# Patient Record
Sex: Female | Born: 1980
Health system: Southern US, Community
[De-identification: ages and names within clinical notes are randomized; demographics above are authoritative.]

## PROBLEM LIST (undated history)

## (undated) DIAGNOSIS — R45851 Suicidal ideations: Secondary | ICD-10-CM

## (undated) DIAGNOSIS — Z3490 Encounter for supervision of normal pregnancy, unspecified, unspecified trimester: Secondary | ICD-10-CM

## (undated) DIAGNOSIS — F603 Borderline personality disorder: Secondary | ICD-10-CM

## (undated) DIAGNOSIS — F1920 Other psychoactive substance dependence, uncomplicated: Secondary | ICD-10-CM

## (undated) HISTORY — PX: CHOLECYSTECTOMY: SHX55

---

## 2000-05-24 ENCOUNTER — Emergency Department (HOSPITAL_COMMUNITY): Admission: EM | Admit: 2000-05-24 | Discharge: 2000-05-24 | Payer: Self-pay | Admitting: Emergency Medicine

## 2001-11-22 ENCOUNTER — Encounter: Payer: Self-pay | Admitting: Emergency Medicine

## 2001-11-22 ENCOUNTER — Emergency Department (HOSPITAL_COMMUNITY): Admission: EM | Admit: 2001-11-22 | Discharge: 2001-11-23 | Payer: Self-pay | Admitting: Emergency Medicine

## 2011-04-19 DIAGNOSIS — Z5181 Encounter for therapeutic drug level monitoring: Secondary | ICD-10-CM | POA: Diagnosis not present

## 2011-04-27 DIAGNOSIS — R41840 Attention and concentration deficit: Secondary | ICD-10-CM | POA: Diagnosis not present

## 2011-04-27 DIAGNOSIS — Q079 Congenital malformation of nervous system, unspecified: Secondary | ICD-10-CM | POA: Diagnosis not present

## 2011-04-27 DIAGNOSIS — M839 Adult osteomalacia, unspecified: Secondary | ICD-10-CM | POA: Diagnosis not present

## 2011-04-27 DIAGNOSIS — Z79899 Other long term (current) drug therapy: Secondary | ICD-10-CM | POA: Diagnosis not present

## 2011-04-27 DIAGNOSIS — F319 Bipolar disorder, unspecified: Secondary | ICD-10-CM | POA: Diagnosis not present

## 2011-04-27 DIAGNOSIS — F5105 Insomnia due to other mental disorder: Secondary | ICD-10-CM | POA: Diagnosis not present

## 2011-04-27 DIAGNOSIS — Z13220 Encounter for screening for lipoid disorders: Secondary | ICD-10-CM | POA: Diagnosis not present

## 2011-04-27 DIAGNOSIS — M545 Low back pain, unspecified: Secondary | ICD-10-CM | POA: Diagnosis not present

## 2011-04-27 DIAGNOSIS — G9009 Other idiopathic peripheral autonomic neuropathy: Secondary | ICD-10-CM | POA: Diagnosis not present

## 2011-04-27 DIAGNOSIS — F489 Nonpsychotic mental disorder, unspecified: Secondary | ICD-10-CM | POA: Diagnosis not present

## 2011-05-18 DIAGNOSIS — F5105 Insomnia due to other mental disorder: Secondary | ICD-10-CM | POA: Diagnosis not present

## 2011-05-18 DIAGNOSIS — M545 Low back pain, unspecified: Secondary | ICD-10-CM | POA: Diagnosis not present

## 2011-05-18 DIAGNOSIS — R41840 Attention and concentration deficit: Secondary | ICD-10-CM | POA: Diagnosis not present

## 2011-05-18 DIAGNOSIS — G9009 Other idiopathic peripheral autonomic neuropathy: Secondary | ICD-10-CM | POA: Diagnosis not present

## 2011-05-18 DIAGNOSIS — F489 Nonpsychotic mental disorder, unspecified: Secondary | ICD-10-CM | POA: Diagnosis not present

## 2011-05-25 DIAGNOSIS — R41840 Attention and concentration deficit: Secondary | ICD-10-CM | POA: Diagnosis not present

## 2011-05-25 DIAGNOSIS — Z5181 Encounter for therapeutic drug level monitoring: Secondary | ICD-10-CM | POA: Diagnosis not present

## 2011-06-15 DIAGNOSIS — R41840 Attention and concentration deficit: Secondary | ICD-10-CM | POA: Diagnosis not present

## 2011-06-15 DIAGNOSIS — Z5181 Encounter for therapeutic drug level monitoring: Secondary | ICD-10-CM | POA: Diagnosis not present

## 2011-06-15 DIAGNOSIS — M545 Low back pain, unspecified: Secondary | ICD-10-CM | POA: Diagnosis not present

## 2011-06-15 DIAGNOSIS — F5105 Insomnia due to other mental disorder: Secondary | ICD-10-CM | POA: Diagnosis not present

## 2011-06-15 DIAGNOSIS — G9009 Other idiopathic peripheral autonomic neuropathy: Secondary | ICD-10-CM | POA: Diagnosis not present

## 2011-06-15 DIAGNOSIS — F489 Nonpsychotic mental disorder, unspecified: Secondary | ICD-10-CM | POA: Diagnosis not present

## 2011-07-15 DIAGNOSIS — M545 Low back pain, unspecified: Secondary | ICD-10-CM | POA: Diagnosis not present

## 2011-07-15 DIAGNOSIS — Z5181 Encounter for therapeutic drug level monitoring: Secondary | ICD-10-CM | POA: Diagnosis not present

## 2011-07-22 DIAGNOSIS — Z5181 Encounter for therapeutic drug level monitoring: Secondary | ICD-10-CM | POA: Diagnosis not present

## 2011-08-16 DIAGNOSIS — F5105 Insomnia due to other mental disorder: Secondary | ICD-10-CM | POA: Diagnosis not present

## 2011-08-16 DIAGNOSIS — R41840 Attention and concentration deficit: Secondary | ICD-10-CM | POA: Diagnosis not present

## 2011-08-16 DIAGNOSIS — F319 Bipolar disorder, unspecified: Secondary | ICD-10-CM | POA: Diagnosis not present

## 2011-08-16 DIAGNOSIS — Z5181 Encounter for therapeutic drug level monitoring: Secondary | ICD-10-CM | POA: Diagnosis not present

## 2011-08-16 DIAGNOSIS — G9009 Other idiopathic peripheral autonomic neuropathy: Secondary | ICD-10-CM | POA: Diagnosis not present

## 2011-08-16 DIAGNOSIS — M545 Low back pain, unspecified: Secondary | ICD-10-CM | POA: Diagnosis not present

## 2011-08-16 DIAGNOSIS — Z79899 Other long term (current) drug therapy: Secondary | ICD-10-CM | POA: Diagnosis not present

## 2011-09-16 DIAGNOSIS — M545 Low back pain, unspecified: Secondary | ICD-10-CM | POA: Diagnosis not present

## 2011-09-17 DIAGNOSIS — K7689 Other specified diseases of liver: Secondary | ICD-10-CM | POA: Diagnosis not present

## 2011-09-17 DIAGNOSIS — Z79899 Other long term (current) drug therapy: Secondary | ICD-10-CM | POA: Diagnosis not present

## 2011-09-17 DIAGNOSIS — D649 Anemia, unspecified: Secondary | ICD-10-CM | POA: Diagnosis not present

## 2011-09-17 DIAGNOSIS — R112 Nausea with vomiting, unspecified: Secondary | ICD-10-CM | POA: Diagnosis not present

## 2011-09-17 DIAGNOSIS — R109 Unspecified abdominal pain: Secondary | ICD-10-CM | POA: Diagnosis not present

## 2011-09-17 DIAGNOSIS — B1920 Unspecified viral hepatitis C without hepatic coma: Secondary | ICD-10-CM | POA: Diagnosis not present

## 2011-09-17 DIAGNOSIS — R509 Fever, unspecified: Secondary | ICD-10-CM | POA: Diagnosis not present

## 2011-09-17 DIAGNOSIS — R1084 Generalized abdominal pain: Secondary | ICD-10-CM | POA: Diagnosis not present

## 2011-09-20 DIAGNOSIS — R799 Abnormal finding of blood chemistry, unspecified: Secondary | ICD-10-CM | POA: Diagnosis not present

## 2011-09-20 DIAGNOSIS — R748 Abnormal levels of other serum enzymes: Secondary | ICD-10-CM | POA: Diagnosis not present

## 2011-09-20 DIAGNOSIS — R300 Dysuria: Secondary | ICD-10-CM | POA: Diagnosis not present

## 2011-09-20 DIAGNOSIS — M545 Low back pain, unspecified: Secondary | ICD-10-CM | POA: Diagnosis not present

## 2011-09-21 DIAGNOSIS — Z5181 Encounter for therapeutic drug level monitoring: Secondary | ICD-10-CM | POA: Diagnosis not present

## 2011-10-14 DIAGNOSIS — Z5181 Encounter for therapeutic drug level monitoring: Secondary | ICD-10-CM | POA: Diagnosis not present

## 2011-10-14 DIAGNOSIS — R109 Unspecified abdominal pain: Secondary | ICD-10-CM | POA: Diagnosis not present

## 2011-10-14 DIAGNOSIS — R41840 Attention and concentration deficit: Secondary | ICD-10-CM | POA: Diagnosis not present

## 2011-10-14 DIAGNOSIS — D649 Anemia, unspecified: Secondary | ICD-10-CM | POA: Diagnosis not present

## 2011-10-14 DIAGNOSIS — M545 Low back pain, unspecified: Secondary | ICD-10-CM | POA: Diagnosis not present

## 2011-10-14 DIAGNOSIS — F319 Bipolar disorder, unspecified: Secondary | ICD-10-CM | POA: Diagnosis not present

## 2011-10-14 DIAGNOSIS — B1710 Acute hepatitis C without hepatic coma: Secondary | ICD-10-CM | POA: Diagnosis not present

## 2011-10-14 DIAGNOSIS — F5105 Insomnia due to other mental disorder: Secondary | ICD-10-CM | POA: Diagnosis not present

## 2011-10-14 DIAGNOSIS — G9009 Other idiopathic peripheral autonomic neuropathy: Secondary | ICD-10-CM | POA: Diagnosis not present

## 2011-10-14 DIAGNOSIS — F489 Nonpsychotic mental disorder, unspecified: Secondary | ICD-10-CM | POA: Diagnosis not present

## 2011-11-14 DIAGNOSIS — M545 Low back pain, unspecified: Secondary | ICD-10-CM | POA: Diagnosis not present

## 2011-11-14 DIAGNOSIS — Z5181 Encounter for therapeutic drug level monitoring: Secondary | ICD-10-CM | POA: Diagnosis not present

## 2011-12-14 DIAGNOSIS — Z5181 Encounter for therapeutic drug level monitoring: Secondary | ICD-10-CM | POA: Diagnosis not present

## 2011-12-14 DIAGNOSIS — G9009 Other idiopathic peripheral autonomic neuropathy: Secondary | ICD-10-CM | POA: Diagnosis not present

## 2011-12-14 DIAGNOSIS — M545 Low back pain, unspecified: Secondary | ICD-10-CM | POA: Diagnosis not present

## 2011-12-14 DIAGNOSIS — R41840 Attention and concentration deficit: Secondary | ICD-10-CM | POA: Diagnosis not present

## 2011-12-14 DIAGNOSIS — F5105 Insomnia due to other mental disorder: Secondary | ICD-10-CM | POA: Diagnosis not present

## 2011-12-14 DIAGNOSIS — F489 Nonpsychotic mental disorder, unspecified: Secondary | ICD-10-CM | POA: Diagnosis not present

## 2012-01-13 DIAGNOSIS — M545 Low back pain, unspecified: Secondary | ICD-10-CM | POA: Diagnosis not present

## 2012-01-13 DIAGNOSIS — R41840 Attention and concentration deficit: Secondary | ICD-10-CM | POA: Diagnosis not present

## 2012-01-13 DIAGNOSIS — Z5181 Encounter for therapeutic drug level monitoring: Secondary | ICD-10-CM | POA: Diagnosis not present

## 2012-02-13 DIAGNOSIS — M545 Low back pain, unspecified: Secondary | ICD-10-CM | POA: Diagnosis not present

## 2012-02-13 DIAGNOSIS — R41840 Attention and concentration deficit: Secondary | ICD-10-CM | POA: Diagnosis not present

## 2012-02-13 DIAGNOSIS — G9009 Other idiopathic peripheral autonomic neuropathy: Secondary | ICD-10-CM | POA: Diagnosis not present

## 2012-02-13 DIAGNOSIS — Z5181 Encounter for therapeutic drug level monitoring: Secondary | ICD-10-CM | POA: Diagnosis not present

## 2012-02-13 DIAGNOSIS — F5105 Insomnia due to other mental disorder: Secondary | ICD-10-CM | POA: Diagnosis not present

## 2012-02-13 DIAGNOSIS — F489 Nonpsychotic mental disorder, unspecified: Secondary | ICD-10-CM | POA: Diagnosis not present

## 2012-03-14 DIAGNOSIS — R41840 Attention and concentration deficit: Secondary | ICD-10-CM | POA: Diagnosis not present

## 2012-03-14 DIAGNOSIS — M545 Low back pain, unspecified: Secondary | ICD-10-CM | POA: Diagnosis not present

## 2012-03-14 DIAGNOSIS — Z5181 Encounter for therapeutic drug level monitoring: Secondary | ICD-10-CM | POA: Diagnosis not present

## 2012-03-17 DIAGNOSIS — R718 Other abnormality of red blood cells: Secondary | ICD-10-CM | POA: Diagnosis not present

## 2012-03-17 DIAGNOSIS — R7309 Other abnormal glucose: Secondary | ICD-10-CM | POA: Diagnosis not present

## 2012-03-17 DIAGNOSIS — R220 Localized swelling, mass and lump, head: Secondary | ICD-10-CM | POA: Diagnosis not present

## 2012-03-17 DIAGNOSIS — R221 Localized swelling, mass and lump, neck: Secondary | ICD-10-CM | POA: Diagnosis not present

## 2012-03-17 DIAGNOSIS — R51 Headache: Secondary | ICD-10-CM | POA: Diagnosis not present

## 2012-04-13 DIAGNOSIS — F5105 Insomnia due to other mental disorder: Secondary | ICD-10-CM | POA: Diagnosis not present

## 2012-04-13 DIAGNOSIS — G9009 Other idiopathic peripheral autonomic neuropathy: Secondary | ICD-10-CM | POA: Diagnosis not present

## 2012-04-13 DIAGNOSIS — M545 Low back pain, unspecified: Secondary | ICD-10-CM | POA: Diagnosis not present

## 2012-04-13 DIAGNOSIS — R41840 Attention and concentration deficit: Secondary | ICD-10-CM | POA: Diagnosis not present

## 2012-04-13 DIAGNOSIS — J029 Acute pharyngitis, unspecified: Secondary | ICD-10-CM | POA: Diagnosis not present

## 2012-04-13 DIAGNOSIS — F489 Nonpsychotic mental disorder, unspecified: Secondary | ICD-10-CM | POA: Diagnosis not present

## 2012-05-14 DIAGNOSIS — Z5181 Encounter for therapeutic drug level monitoring: Secondary | ICD-10-CM | POA: Diagnosis not present

## 2012-05-14 DIAGNOSIS — M545 Low back pain, unspecified: Secondary | ICD-10-CM | POA: Diagnosis not present

## 2012-06-12 DIAGNOSIS — M545 Low back pain, unspecified: Secondary | ICD-10-CM | POA: Diagnosis not present

## 2012-06-12 DIAGNOSIS — Z5181 Encounter for therapeutic drug level monitoring: Secondary | ICD-10-CM | POA: Diagnosis not present

## 2012-06-12 DIAGNOSIS — Z79899 Other long term (current) drug therapy: Secondary | ICD-10-CM | POA: Diagnosis not present

## 2012-06-12 DIAGNOSIS — G9009 Other idiopathic peripheral autonomic neuropathy: Secondary | ICD-10-CM | POA: Diagnosis not present

## 2012-06-12 DIAGNOSIS — R41840 Attention and concentration deficit: Secondary | ICD-10-CM | POA: Diagnosis not present

## 2012-06-12 DIAGNOSIS — F5105 Insomnia due to other mental disorder: Secondary | ICD-10-CM | POA: Diagnosis not present

## 2012-06-12 DIAGNOSIS — F489 Nonpsychotic mental disorder, unspecified: Secondary | ICD-10-CM | POA: Diagnosis not present

## 2012-06-20 DIAGNOSIS — Y92009 Unspecified place in unspecified non-institutional (private) residence as the place of occurrence of the external cause: Secondary | ICD-10-CM | POA: Diagnosis not present

## 2012-06-20 DIAGNOSIS — R404 Transient alteration of awareness: Secondary | ICD-10-CM | POA: Diagnosis not present

## 2012-06-20 DIAGNOSIS — E876 Hypokalemia: Secondary | ICD-10-CM | POA: Diagnosis not present

## 2012-06-20 DIAGNOSIS — E1169 Type 2 diabetes mellitus with other specified complication: Secondary | ICD-10-CM | POA: Diagnosis not present

## 2012-06-20 DIAGNOSIS — R5383 Other fatigue: Secondary | ICD-10-CM | POA: Diagnosis not present

## 2012-06-20 DIAGNOSIS — B1920 Unspecified viral hepatitis C without hepatic coma: Secondary | ICD-10-CM | POA: Diagnosis present

## 2012-06-20 DIAGNOSIS — R4182 Altered mental status, unspecified: Secondary | ICD-10-CM | POA: Diagnosis not present

## 2012-06-20 DIAGNOSIS — R5381 Other malaise: Secondary | ICD-10-CM | POA: Diagnosis not present

## 2012-06-20 DIAGNOSIS — R0989 Other specified symptoms and signs involving the circulatory and respiratory systems: Secondary | ICD-10-CM | POA: Diagnosis not present

## 2012-06-20 DIAGNOSIS — T413X5A Adverse effect of local anesthetics, initial encounter: Secondary | ICD-10-CM | POA: Diagnosis present

## 2012-06-20 DIAGNOSIS — F17200 Nicotine dependence, unspecified, uncomplicated: Secondary | ICD-10-CM | POA: Diagnosis not present

## 2012-06-20 DIAGNOSIS — G930 Cerebral cysts: Secondary | ICD-10-CM | POA: Diagnosis not present

## 2012-06-20 DIAGNOSIS — G929 Unspecified toxic encephalopathy: Secondary | ICD-10-CM | POA: Diagnosis present

## 2012-06-20 DIAGNOSIS — E162 Hypoglycemia, unspecified: Secondary | ICD-10-CM | POA: Diagnosis not present

## 2012-06-20 DIAGNOSIS — E161 Other hypoglycemia: Secondary | ICD-10-CM | POA: Diagnosis not present

## 2012-06-20 DIAGNOSIS — G92 Toxic encephalopathy: Secondary | ICD-10-CM | POA: Diagnosis not present

## 2012-07-12 DIAGNOSIS — Z79899 Other long term (current) drug therapy: Secondary | ICD-10-CM | POA: Diagnosis not present

## 2012-07-12 DIAGNOSIS — M545 Low back pain, unspecified: Secondary | ICD-10-CM | POA: Diagnosis not present

## 2012-08-10 DIAGNOSIS — R41840 Attention and concentration deficit: Secondary | ICD-10-CM | POA: Diagnosis not present

## 2012-08-10 DIAGNOSIS — G9009 Other idiopathic peripheral autonomic neuropathy: Secondary | ICD-10-CM | POA: Diagnosis not present

## 2012-08-10 DIAGNOSIS — F319 Bipolar disorder, unspecified: Secondary | ICD-10-CM | POA: Diagnosis not present

## 2012-08-10 DIAGNOSIS — M545 Low back pain, unspecified: Secondary | ICD-10-CM | POA: Diagnosis not present

## 2012-09-11 DIAGNOSIS — Z79899 Other long term (current) drug therapy: Secondary | ICD-10-CM | POA: Diagnosis not present

## 2012-09-11 DIAGNOSIS — R41840 Attention and concentration deficit: Secondary | ICD-10-CM | POA: Diagnosis not present

## 2012-09-11 DIAGNOSIS — Z5181 Encounter for therapeutic drug level monitoring: Secondary | ICD-10-CM | POA: Diagnosis not present

## 2012-09-11 DIAGNOSIS — M545 Low back pain, unspecified: Secondary | ICD-10-CM | POA: Diagnosis not present

## 2012-10-09 DIAGNOSIS — R32 Unspecified urinary incontinence: Secondary | ICD-10-CM | POA: Diagnosis not present

## 2012-10-09 DIAGNOSIS — M545 Low back pain, unspecified: Secondary | ICD-10-CM | POA: Diagnosis not present

## 2012-10-09 DIAGNOSIS — R41840 Attention and concentration deficit: Secondary | ICD-10-CM | POA: Diagnosis not present

## 2012-10-09 DIAGNOSIS — Z5181 Encounter for therapeutic drug level monitoring: Secondary | ICD-10-CM | POA: Diagnosis not present

## 2012-10-09 DIAGNOSIS — F319 Bipolar disorder, unspecified: Secondary | ICD-10-CM | POA: Diagnosis not present

## 2012-10-09 DIAGNOSIS — Z79899 Other long term (current) drug therapy: Secondary | ICD-10-CM | POA: Diagnosis not present

## 2012-12-10 DIAGNOSIS — R41840 Attention and concentration deficit: Secondary | ICD-10-CM | POA: Diagnosis not present

## 2012-12-10 DIAGNOSIS — Z5181 Encounter for therapeutic drug level monitoring: Secondary | ICD-10-CM | POA: Diagnosis not present

## 2012-12-10 DIAGNOSIS — G9009 Other idiopathic peripheral autonomic neuropathy: Secondary | ICD-10-CM | POA: Diagnosis not present

## 2012-12-10 DIAGNOSIS — F319 Bipolar disorder, unspecified: Secondary | ICD-10-CM | POA: Diagnosis not present

## 2012-12-10 DIAGNOSIS — M545 Low back pain, unspecified: Secondary | ICD-10-CM | POA: Diagnosis not present

## 2012-12-10 DIAGNOSIS — Z79899 Other long term (current) drug therapy: Secondary | ICD-10-CM | POA: Diagnosis not present

## 2012-12-25 DIAGNOSIS — M545 Low back pain, unspecified: Secondary | ICD-10-CM | POA: Diagnosis not present

## 2013-01-08 DIAGNOSIS — F29 Unspecified psychosis not due to a substance or known physiological condition: Secondary | ICD-10-CM | POA: Diagnosis not present

## 2013-01-08 DIAGNOSIS — J209 Acute bronchitis, unspecified: Secondary | ICD-10-CM | POA: Diagnosis not present

## 2013-01-08 DIAGNOSIS — R059 Cough, unspecified: Secondary | ICD-10-CM | POA: Diagnosis not present

## 2013-01-08 DIAGNOSIS — R6883 Chills (without fever): Secondary | ICD-10-CM | POA: Diagnosis not present

## 2013-01-08 DIAGNOSIS — F489 Nonpsychotic mental disorder, unspecified: Secondary | ICD-10-CM | POA: Diagnosis not present

## 2013-01-08 DIAGNOSIS — R05 Cough: Secondary | ICD-10-CM | POA: Diagnosis not present

## 2013-01-09 DIAGNOSIS — R41840 Attention and concentration deficit: Secondary | ICD-10-CM | POA: Diagnosis not present

## 2013-01-09 DIAGNOSIS — Z5181 Encounter for therapeutic drug level monitoring: Secondary | ICD-10-CM | POA: Diagnosis not present

## 2013-01-09 DIAGNOSIS — Z79899 Other long term (current) drug therapy: Secondary | ICD-10-CM | POA: Diagnosis not present

## 2013-01-09 DIAGNOSIS — M545 Low back pain, unspecified: Secondary | ICD-10-CM | POA: Diagnosis not present

## 2013-02-06 DIAGNOSIS — G9009 Other idiopathic peripheral autonomic neuropathy: Secondary | ICD-10-CM | POA: Diagnosis not present

## 2013-02-06 DIAGNOSIS — M545 Low back pain, unspecified: Secondary | ICD-10-CM | POA: Diagnosis not present

## 2013-02-06 DIAGNOSIS — R41840 Attention and concentration deficit: Secondary | ICD-10-CM | POA: Diagnosis not present

## 2013-02-06 DIAGNOSIS — F319 Bipolar disorder, unspecified: Secondary | ICD-10-CM | POA: Diagnosis not present

## 2013-03-08 DIAGNOSIS — Z79899 Other long term (current) drug therapy: Secondary | ICD-10-CM | POA: Diagnosis not present

## 2013-03-08 DIAGNOSIS — Z5181 Encounter for therapeutic drug level monitoring: Secondary | ICD-10-CM | POA: Diagnosis not present

## 2013-03-19 DIAGNOSIS — H109 Unspecified conjunctivitis: Secondary | ICD-10-CM | POA: Diagnosis not present

## 2013-03-19 DIAGNOSIS — H00029 Hordeolum internum unspecified eye, unspecified eyelid: Secondary | ICD-10-CM | POA: Diagnosis not present

## 2013-04-08 DIAGNOSIS — Z79899 Other long term (current) drug therapy: Secondary | ICD-10-CM | POA: Diagnosis not present

## 2013-04-08 DIAGNOSIS — M545 Low back pain, unspecified: Secondary | ICD-10-CM | POA: Diagnosis not present

## 2013-04-08 DIAGNOSIS — R41840 Attention and concentration deficit: Secondary | ICD-10-CM | POA: Diagnosis not present

## 2013-04-08 DIAGNOSIS — F319 Bipolar disorder, unspecified: Secondary | ICD-10-CM | POA: Diagnosis not present

## 2013-04-19 DIAGNOSIS — J0100 Acute maxillary sinusitis, unspecified: Secondary | ICD-10-CM | POA: Diagnosis not present

## 2013-04-19 DIAGNOSIS — J209 Acute bronchitis, unspecified: Secondary | ICD-10-CM | POA: Diagnosis not present

## 2013-05-10 DIAGNOSIS — Z79899 Other long term (current) drug therapy: Secondary | ICD-10-CM | POA: Diagnosis not present

## 2013-06-11 DIAGNOSIS — F319 Bipolar disorder, unspecified: Secondary | ICD-10-CM | POA: Diagnosis not present

## 2013-06-11 DIAGNOSIS — G9009 Other idiopathic peripheral autonomic neuropathy: Secondary | ICD-10-CM | POA: Diagnosis not present

## 2013-06-11 DIAGNOSIS — R32 Unspecified urinary incontinence: Secondary | ICD-10-CM | POA: Diagnosis not present

## 2013-06-11 DIAGNOSIS — M545 Low back pain, unspecified: Secondary | ICD-10-CM | POA: Diagnosis not present

## 2013-07-05 DIAGNOSIS — H8190 Unspecified disorder of vestibular function, unspecified ear: Secondary | ICD-10-CM | POA: Diagnosis not present

## 2013-07-05 DIAGNOSIS — M25569 Pain in unspecified knee: Secondary | ICD-10-CM | POA: Diagnosis not present

## 2013-07-05 DIAGNOSIS — M545 Low back pain, unspecified: Secondary | ICD-10-CM | POA: Diagnosis not present

## 2013-07-05 DIAGNOSIS — IMO0001 Reserved for inherently not codable concepts without codable children: Secondary | ICD-10-CM | POA: Diagnosis not present

## 2013-07-05 DIAGNOSIS — M542 Cervicalgia: Secondary | ICD-10-CM | POA: Diagnosis not present

## 2013-07-05 DIAGNOSIS — G43109 Migraine with aura, not intractable, without status migrainosus: Secondary | ICD-10-CM | POA: Diagnosis not present

## 2013-07-05 DIAGNOSIS — M79609 Pain in unspecified limb: Secondary | ICD-10-CM | POA: Diagnosis not present

## 2013-07-05 DIAGNOSIS — M533 Sacrococcygeal disorders, not elsewhere classified: Secondary | ICD-10-CM | POA: Diagnosis not present

## 2013-07-05 DIAGNOSIS — Z79899 Other long term (current) drug therapy: Secondary | ICD-10-CM | POA: Diagnosis not present

## 2013-07-05 DIAGNOSIS — G9009 Other idiopathic peripheral autonomic neuropathy: Secondary | ICD-10-CM | POA: Diagnosis not present

## 2013-07-12 DIAGNOSIS — M439 Deforming dorsopathy, unspecified: Secondary | ICD-10-CM | POA: Diagnosis not present

## 2013-07-12 DIAGNOSIS — M545 Low back pain, unspecified: Secondary | ICD-10-CM | POA: Diagnosis not present

## 2013-07-19 DIAGNOSIS — M79609 Pain in unspecified limb: Secondary | ICD-10-CM | POA: Diagnosis not present

## 2013-07-19 DIAGNOSIS — M533 Sacrococcygeal disorders, not elsewhere classified: Secondary | ICD-10-CM | POA: Diagnosis not present

## 2013-07-19 DIAGNOSIS — M25569 Pain in unspecified knee: Secondary | ICD-10-CM | POA: Diagnosis not present

## 2013-07-19 DIAGNOSIS — M542 Cervicalgia: Secondary | ICD-10-CM | POA: Diagnosis not present

## 2013-07-19 DIAGNOSIS — M545 Low back pain, unspecified: Secondary | ICD-10-CM | POA: Diagnosis not present

## 2013-07-19 DIAGNOSIS — Z79899 Other long term (current) drug therapy: Secondary | ICD-10-CM | POA: Diagnosis not present

## 2013-07-22 DIAGNOSIS — M545 Low back pain, unspecified: Secondary | ICD-10-CM | POA: Diagnosis not present

## 2013-07-22 DIAGNOSIS — R32 Unspecified urinary incontinence: Secondary | ICD-10-CM | POA: Diagnosis not present

## 2013-07-22 DIAGNOSIS — F489 Nonpsychotic mental disorder, unspecified: Secondary | ICD-10-CM | POA: Diagnosis not present

## 2013-07-22 DIAGNOSIS — F5105 Insomnia due to other mental disorder: Secondary | ICD-10-CM | POA: Diagnosis not present

## 2013-07-22 DIAGNOSIS — G9009 Other idiopathic peripheral autonomic neuropathy: Secondary | ICD-10-CM | POA: Diagnosis not present

## 2013-07-30 DIAGNOSIS — R569 Unspecified convulsions: Secondary | ICD-10-CM | POA: Diagnosis not present

## 2013-07-30 DIAGNOSIS — F29 Unspecified psychosis not due to a substance or known physiological condition: Secondary | ICD-10-CM | POA: Diagnosis not present

## 2013-07-30 DIAGNOSIS — R0989 Other specified symptoms and signs involving the circulatory and respiratory systems: Secondary | ICD-10-CM | POA: Diagnosis not present

## 2013-07-30 DIAGNOSIS — F17200 Nicotine dependence, unspecified, uncomplicated: Secondary | ICD-10-CM | POA: Diagnosis not present

## 2013-08-02 DIAGNOSIS — M533 Sacrococcygeal disorders, not elsewhere classified: Secondary | ICD-10-CM | POA: Diagnosis not present

## 2013-08-02 DIAGNOSIS — M545 Low back pain, unspecified: Secondary | ICD-10-CM | POA: Diagnosis not present

## 2013-08-02 DIAGNOSIS — M5137 Other intervertebral disc degeneration, lumbosacral region: Secondary | ICD-10-CM | POA: Diagnosis not present

## 2013-08-02 DIAGNOSIS — M5430 Sciatica, unspecified side: Secondary | ICD-10-CM | POA: Diagnosis not present

## 2013-08-02 DIAGNOSIS — G894 Chronic pain syndrome: Secondary | ICD-10-CM | POA: Diagnosis not present

## 2013-08-02 DIAGNOSIS — Z79899 Other long term (current) drug therapy: Secondary | ICD-10-CM | POA: Diagnosis not present

## 2013-08-20 DIAGNOSIS — F319 Bipolar disorder, unspecified: Secondary | ICD-10-CM | POA: Diagnosis not present

## 2013-08-20 DIAGNOSIS — R32 Unspecified urinary incontinence: Secondary | ICD-10-CM | POA: Diagnosis not present

## 2013-08-20 DIAGNOSIS — R41840 Attention and concentration deficit: Secondary | ICD-10-CM | POA: Diagnosis not present

## 2013-08-20 DIAGNOSIS — F489 Nonpsychotic mental disorder, unspecified: Secondary | ICD-10-CM | POA: Diagnosis not present

## 2013-08-20 DIAGNOSIS — F5105 Insomnia due to other mental disorder: Secondary | ICD-10-CM | POA: Diagnosis not present

## 2013-09-06 DIAGNOSIS — M545 Low back pain, unspecified: Secondary | ICD-10-CM | POA: Diagnosis not present

## 2013-09-06 DIAGNOSIS — Z79899 Other long term (current) drug therapy: Secondary | ICD-10-CM | POA: Diagnosis not present

## 2013-09-06 DIAGNOSIS — S335XXA Sprain of ligaments of lumbar spine, initial encounter: Secondary | ICD-10-CM | POA: Diagnosis not present

## 2013-09-06 DIAGNOSIS — M533 Sacrococcygeal disorders, not elsewhere classified: Secondary | ICD-10-CM | POA: Diagnosis not present

## 2013-09-06 DIAGNOSIS — M5137 Other intervertebral disc degeneration, lumbosacral region: Secondary | ICD-10-CM | POA: Diagnosis not present

## 2013-09-25 DIAGNOSIS — F489 Nonpsychotic mental disorder, unspecified: Secondary | ICD-10-CM | POA: Diagnosis not present

## 2013-09-25 DIAGNOSIS — F3289 Other specified depressive episodes: Secondary | ICD-10-CM | POA: Diagnosis not present

## 2013-09-25 DIAGNOSIS — T43502A Poisoning by unspecified antipsychotics and neuroleptics, intentional self-harm, initial encounter: Secondary | ICD-10-CM | POA: Diagnosis not present

## 2013-09-25 DIAGNOSIS — T43294A Poisoning by other antidepressants, undetermined, initial encounter: Secondary | ICD-10-CM | POA: Diagnosis not present

## 2013-09-25 DIAGNOSIS — T43501A Poisoning by unspecified antipsychotics and neuroleptics, accidental (unintentional), initial encounter: Secondary | ICD-10-CM | POA: Diagnosis not present

## 2013-09-25 DIAGNOSIS — T50901A Poisoning by unspecified drugs, medicaments and biological substances, accidental (unintentional), initial encounter: Secondary | ICD-10-CM | POA: Diagnosis not present

## 2013-09-25 DIAGNOSIS — E876 Hypokalemia: Secondary | ICD-10-CM | POA: Diagnosis not present

## 2013-09-25 DIAGNOSIS — F329 Major depressive disorder, single episode, unspecified: Secondary | ICD-10-CM | POA: Diagnosis not present

## 2013-09-27 DIAGNOSIS — F329 Major depressive disorder, single episode, unspecified: Secondary | ICD-10-CM | POA: Diagnosis not present

## 2013-09-27 DIAGNOSIS — F3289 Other specified depressive episodes: Secondary | ICD-10-CM | POA: Diagnosis not present

## 2013-09-27 DIAGNOSIS — R45851 Suicidal ideations: Secondary | ICD-10-CM | POA: Diagnosis not present

## 2013-09-27 DIAGNOSIS — R51 Headache: Secondary | ICD-10-CM | POA: Diagnosis not present

## 2013-09-30 DIAGNOSIS — F309 Manic episode, unspecified: Secondary | ICD-10-CM | POA: Diagnosis not present

## 2013-10-18 DIAGNOSIS — R41840 Attention and concentration deficit: Secondary | ICD-10-CM | POA: Diagnosis not present

## 2013-10-18 DIAGNOSIS — M545 Low back pain, unspecified: Secondary | ICD-10-CM | POA: Diagnosis not present

## 2013-10-18 DIAGNOSIS — F319 Bipolar disorder, unspecified: Secondary | ICD-10-CM | POA: Diagnosis not present

## 2013-12-16 ENCOUNTER — Encounter (HOSPITAL_COMMUNITY): Payer: Self-pay | Admitting: Emergency Medicine

## 2013-12-16 ENCOUNTER — Emergency Department (HOSPITAL_COMMUNITY)
Admission: EM | Admit: 2013-12-16 | Discharge: 2013-12-17 | Disposition: A | Discharge status: Other Acute Inpatient Hospital | Payer: Medicare Other | Source: Home / Self Care | Attending: Emergency Medicine | Admitting: Emergency Medicine

## 2013-12-16 ENCOUNTER — Emergency Department (HOSPITAL_COMMUNITY): Payer: Medicare Other

## 2013-12-16 DIAGNOSIS — F1110 Opioid abuse, uncomplicated: Secondary | ICD-10-CM

## 2013-12-16 DIAGNOSIS — O9989 Other specified diseases and conditions complicating pregnancy, childbirth and the puerperium: Secondary | ICD-10-CM

## 2013-12-16 DIAGNOSIS — O99330 Smoking (tobacco) complicating pregnancy, unspecified trimester: Secondary | ICD-10-CM | POA: Insufficient documentation

## 2013-12-16 DIAGNOSIS — R45851 Suicidal ideations: Secondary | ICD-10-CM | POA: Insufficient documentation

## 2013-12-16 DIAGNOSIS — O99320 Drug use complicating pregnancy, unspecified trimester: Secondary | ICD-10-CM | POA: Diagnosis not present

## 2013-12-16 DIAGNOSIS — F332 Major depressive disorder, recurrent severe without psychotic features: Secondary | ICD-10-CM | POA: Diagnosis not present

## 2013-12-16 DIAGNOSIS — Z79899 Other long term (current) drug therapy: Secondary | ICD-10-CM | POA: Insufficient documentation

## 2013-12-16 DIAGNOSIS — R109 Unspecified abdominal pain: Secondary | ICD-10-CM

## 2013-12-16 DIAGNOSIS — F1920 Other psychoactive substance dependence, uncomplicated: Secondary | ICD-10-CM | POA: Diagnosis not present

## 2013-12-16 HISTORY — DX: Other psychoactive substance dependence, uncomplicated: F19.20

## 2013-12-16 HISTORY — DX: Borderline personality disorder: F60.3

## 2013-12-16 HISTORY — DX: Suicidal ideations: R45.851

## 2013-12-16 LAB — COMPREHENSIVE METABOLIC PANEL
ALK PHOS: 125 U/L — AB (ref 39–117)
ALT: 25 U/L (ref 0–35)
ANION GAP: 14 (ref 5–15)
AST: 13 U/L (ref 0–37)
Albumin: 3.7 g/dL (ref 3.5–5.2)
BILIRUBIN TOTAL: 0.6 mg/dL (ref 0.3–1.2)
BUN: 4 mg/dL — AB (ref 6–23)
CO2: 22 mEq/L (ref 19–32)
Calcium: 9.1 mg/dL (ref 8.4–10.5)
Chloride: 102 mEq/L (ref 96–112)
Creatinine, Ser: 0.61 mg/dL (ref 0.50–1.10)
GLUCOSE: 102 mg/dL — AB (ref 70–99)
POTASSIUM: 3.7 meq/L (ref 3.7–5.3)
Sodium: 138 mEq/L (ref 137–147)
TOTAL PROTEIN: 7 g/dL (ref 6.0–8.3)

## 2013-12-16 LAB — RAPID URINE DRUG SCREEN, HOSP PERFORMED
Amphetamines: NOT DETECTED
BARBITURATES: NOT DETECTED
Benzodiazepines: NOT DETECTED
Cocaine: NOT DETECTED
OPIATES: POSITIVE — AB
TETRAHYDROCANNABINOL: NOT DETECTED

## 2013-12-16 LAB — SALICYLATE LEVEL: Salicylate Lvl: <2 mg/dL — ABNORMAL LOW (ref 2.8–20.0)

## 2013-12-16 LAB — CBC
HCT: 41.3 % (ref 36.0–46.0)
HEMOGLOBIN: 14 g/dL (ref 12.0–15.0)
MCH: 28.2 pg (ref 26.0–34.0)
MCHC: 33.9 g/dL (ref 30.0–36.0)
MCV: 83.1 fL (ref 78.0–100.0)
Platelets: 223 10*3/uL (ref 150–400)
RBC: 4.97 MIL/uL (ref 3.87–5.11)
RDW: 13.3 % (ref 11.5–15.5)
WBC: 10.3 10*3/uL (ref 4.0–10.5)

## 2013-12-16 LAB — WET PREP, GENITAL
Trich, Wet Prep: NONE SEEN
Yeast Wet Prep HPF POC: NONE SEEN

## 2013-12-16 LAB — ETHANOL: Alcohol, Ethyl (B): <11 mg/dL (ref 0–11)

## 2013-12-16 LAB — ACETAMINOPHEN LEVEL

## 2013-12-16 LAB — POC URINE PREG, ED: Preg Test, Ur: POSITIVE — AB

## 2013-12-16 MED ORDER — ACETAMINOPHEN 325 MG PO TABS: 650.0000 mg | ORAL_TABLET | ORAL | Status: DC | PRN

## 2013-12-16 MED ORDER — COMPLETENATE 29-1 MG PO CHEW
1.0000 | CHEWABLE_TABLET | Freq: Every day | ORAL | Status: DC
  Filled 2013-12-16: qty 1

## 2013-12-16 MED ORDER — OXYCODONE HCL 5 MG PO TABS: 5.0000 mg | ORAL_TABLET | Freq: Four times a day (QID) | ORAL | Status: DC | PRN

## 2013-12-16 MED ORDER — DIPHENHYDRAMINE HCL 25 MG PO CAPS
50.0000 mg | ORAL_CAPSULE | Freq: Every evening | ORAL | Status: DC | PRN
  Administered 2013-12-16: 50 mg via ORAL
  Filled 2013-12-16: qty 2

## 2013-12-16 NOTE — ED Notes (Signed)
Patient has returned from US.

## 2013-12-16 NOTE — ED Notes (Signed)
STAFFING MADE AWARE NEED OF SITTER

## 2013-12-16 NOTE — ED Notes (Addendum)
Pt reports SI, due to kids being taken away. Reports detox from pain pills oxycodone and hydrocodone. Pt is a x 4. Thinks she might be pregnant, took a positive pregnancy test. Pt smiling inappropriately during triage.

## 2013-12-16 NOTE — ED Notes (Signed)
Pt given turkey sandwich

## 2013-12-16 NOTE — ED Provider Notes (Signed)
CSN: [id number]     Arrival date & time 12/16/13  1728 History   First MD Initiated Contact with Patient 12/16/13 1849     Chief Complaint  Patient presents with  . Suicidal     (Consider location/radiation/quality/duration/timing/severity/associated sxs/prior Treatment) HPI Christina Burch is a 33 y.o. female with a history of borderline personality disorder, previous suicide attempts who comes in for evaluation of suicidal thoughts. She says she has not taken her Abilify or Trileptal for several weeks because she ran out.  and has led to her having suicidal thoughts. She does not have a plan in place, but says she may do it the way she has tried in the past which is overdosing on pain medicine. She reports also having tried slitting her wrists in the past. She denies any homicidal ideations. Denies hearing voices or having hallucinations. She reports polysubstance abuse over the past few weeks, but denies benzodiazepine use or alcohol consumption. She says most recently she took 3 hydrocodone yesterday, 10 [name]'s on Thursday and Saturday she took 7 oxycodone all at once. She also reports taking a home pregnancy test yesterday and saying it is positive. She has only taken tylenol cold today, but denies having any cold symptoms. Denies fever, chest pain, shortness of breath, numbness or weakness  Past Medical History  Diagnosis Date  . Borderline personality disorder   . Suicidal thoughts   . Addiction to drug    No past surgical history on file. No family history on file. History  Substance Use Topics  . Smoking status: Current Every Day Smoker  . Smokeless tobacco: Not on file  . Alcohol Use: No   OB History   Grav Para Term Preterm Abortions TAB SAB Ect Mult Living                 Review of Systems  Constitutional: Negative for fever.  HENT: Negative for sore throat.   Eyes: Negative for visual disturbance.  Respiratory: Negative for shortness of breath.   Cardiovascular:  Negative for chest pain.  Gastrointestinal: Negative for abdominal pain.  Endocrine: Negative for polyuria.  Genitourinary: Negative for dysuria.  Skin: Negative for rash.  Neurological: Negative for headaches.  Psychiatric/Behavioral: Positive for suicidal ideas.      Allergies  Review of patient's allergies indicates no known allergies.  Home Medications   Prior to Admission medications   Medication Sig Start Date End Date Taking? Authorizing Provider  Pseudoeph-CPM-DM-APAP (TYLENOL COLD) 30-2-15-325 MG TABS Take 2 tablets by mouth daily as needed (for cold).   Yes Historical Provider, MD  amphetamine-dextroamphetamine (ADDERALL XR) 30 MG 24 hr capsule Take 30 mg by mouth daily.    Historical Provider, MD  ARIPiprazole (ABILIFY) 30 MG tablet Take 30 mg by mouth daily.    Historical Provider, MD  buprenorphine-naloxone (SUBOXONE) 8-2 MG SUBL SL tablet Place 0.5 tablets under the tongue 2 (two) times daily.    Historical Provider, MD  gabapentin (NEURONTIN) 600 MG tablet Take 600 mg by mouth 3 (three) times daily.    Historical Provider, MD  Iron TABS Take 1 tablet by mouth daily.    Historical Provider, MD  oxcarbazepine (TRILEPTAL) 600 MG tablet Take 600 mg by mouth 2 (two) times daily.    Historical Provider, MD  oxybutynin (DITROPAN) 5 MG tablet Take 5 mg by mouth daily.    Historical Provider, MD   BP 147/99  Pulse 97  Temp(Src) 98.2 F (36.8 C) (Oral)  Resp 15  SpO2 99%  LMP 11/06/2013 Physical Exam  Nursing note and vitals reviewed. Constitutional: She is oriented to person, place, and time. She appears well-developed and well-nourished.  HENT:  Head: Normocephalic and atraumatic.  Mouth/Throat: Oropharynx is clear and moist.  Eyes: Conjunctivae are normal. Pupils are equal, round, and reactive to light. Right eye exhibits no discharge. Left eye exhibits no discharge. No scleral icterus.  Neck: Neck supple.  Cardiovascular: Normal rate, regular rhythm and normal heart  sounds.   Pulmonary/Chest: Effort normal and breath sounds normal. No respiratory distress. She has no wheezes. She has no rales.  Abdominal: Soft. She exhibits no mass. There is no rebound and no guarding.  Mild ttp to suprapubic abdomen  Genitourinary:  Chaperone was present for the entire genital exam. No lesions or rashes appreciated on vulva. Cervix visualized on speculum exam and appropriate cultures sampled. No blood in vaginal vault. No discharge Upon bi manual exam- No TTP of the adnexa, no cervical motion tenderness. No fullness or masses appreciated. No abnormalities appreciated in structural anatomy.   Musculoskeletal: She exhibits no tenderness.  Neurological: She is alert and oriented to person, place, and time.  Cranial Nerves II-XII grossly intact  Skin: Skin is warm and dry. No rash noted.  Psychiatric: She has a normal mood and affect.  Pt smiles inappropriately throughout interview.     ED Course  Procedures (including critical care time) Labs Review Labs Reviewed  WET PREP, GENITAL - Abnormal; Notable for the following:    Clue Cells Wet Prep HPF POC FEW (*)    WBC, Wet Prep HPF POC FEW (*)    All other components within normal limits  COMPREHENSIVE METABOLIC PANEL - Abnormal; Notable for the following:    Glucose, Bld 102 (*)    BUN 4 (*)    Alkaline Phosphatase 125 (*)    All other components within normal limits  SALICYLATE LEVEL - Abnormal; Notable for the following:    Salicylate Lvl <2.0 (*)    All other components within normal limits  URINE RAPID DRUG SCREEN (HOSP PERFORMED) - Abnormal; Notable for the following:    Opiates POSITIVE (*)    All other components within normal limits  POC URINE PREG, ED - Abnormal; Notable for the following:    Preg Test, Ur POSITIVE (*)    All other components within normal limits  GC/CHLAMYDIA PROBE AMP  ACETAMINOPHEN LEVEL  CBC  ETHANOL  HCG, QUANTITATIVE, PREGNANCY  URINALYSIS, ROUTINE W REFLEX MICROSCOPIC   RPR  HIV ANTIBODY (ROUTINE TESTING)    Imaging Review US Ob Comp Less 14 Wks  12/16/2013   CLINICAL DATA:  Cramping. Rule out ectopic. Quantitative beta HCG is pending. By LMP the patient is 5 weeks 0 days.  EXAM: OBSTETRIC <14 WK [place] AND TRANSVAGINAL OB US  TECHNIQUE: Both transabdominal and transvaginal ultrasound examinations were performed for complete evaluation of the gestation as well as the maternal uterus, adnexal regions, and pelvic cul-de-sac. Transvaginal technique was performed to assess early pregnancy.  COMPARISON:  None applicable  FINDINGS: Intrauterine gestational sac: Present  Yolk sac:  Present  Embryo:  Not seen  Cardiac Activity: Not seen  MSD:  8.3  mm   5 w   4  d  Maternal uterus/adnexae: No subchorionic hemorrhage identified. Right ovarian cyst measures 2.4 x 2.6 x 2.5 cm and contains internal echoes. The left ovary has a normal appearance and measures 2.2 x 1.6 x 2.3 cm. Small amount of free pelvic fluid is noted.  IMPRESSION:  1. Intrauterine gestational sac with yolk sac. 2. Embryo is not yet visible. 3. Followup ultrasound is suggested in 2 weeks to establish presence of embryo and for dating purposes. 4. Probable minimally complex right corpus luteum cyst. Followup is recommended.   Electronically Signed   By: Rosalie Gums M.D.   On: 12/16/2013 21:44   US Ob Transvaginal  12/16/2013   CLINICAL DATA:  Cramping. Rule out ectopic. Quantitative beta HCG is pending. By LMP the patient is 5 weeks 0 days.  EXAM: OBSTETRIC <14 WK [place] AND TRANSVAGINAL OB US  TECHNIQUE: Both transabdominal and transvaginal ultrasound examinations were performed for complete evaluation of the gestation as well as the maternal uterus, adnexal regions, and pelvic cul-de-sac. Transvaginal technique was performed to assess early pregnancy.  COMPARISON:  None applicable  FINDINGS: Intrauterine gestational sac: Present  Yolk sac:  Present  Embryo:  Not seen  Cardiac Activity: Not seen  MSD:  8.3  mm   5 w   4  d   Maternal uterus/adnexae: No subchorionic hemorrhage identified. Right ovarian cyst measures 2.4 x 2.6 x 2.5 cm and contains internal echoes. The left ovary has a normal appearance and measures 2.2 x 1.6 x 2.3 cm. Small amount of free pelvic fluid is noted.  IMPRESSION: 1. Intrauterine gestational sac with yolk sac. 2. Embryo is not yet visible. 3. Followup ultrasound is suggested in 2 weeks to establish presence of embryo and for dating purposes. 4. Probable minimally complex right corpus luteum cyst. Followup is recommended.   Electronically Signed   By: Rosalie Gums M.D.   On: 12/16/2013 21:44     EKG Interpretation None       Meds given in ED:  Medications  oxyCODONE (Oxy IR/ROXICODONE) immediate release tablet 5 mg (not administered)  acetaminophen (TYLENOL) tablet 650 mg (not administered)  prenatal vitamin w/FE, FA (NATACHEW) chewable tablet 1 tablet (not administered)  diphenhydrAMINE (BENADRYL) capsule 50 mg (50 mg Oral Given 12/16/13 2336)    New Prescriptions   No medications on file   Filed Vitals:   12/16/13 2130 12/16/13 2145 12/16/13 2215 12/16/13 2230  BP: 144/88 131/88 129/82 147/99  Pulse: 88 102 91 97  Temp:      TempSrc:      Resp:      SpO2: 99% 96% 99% 99%    MDM  Vitals stable - WNL -afebrile Spoke with pharmacy they recommended low-dose narcotic instead of clonidine to deter opiate withdrawal Pt resting comfortably in ED, shows no signs of actively withdrawing from opiates IVC paperwork filled out.  Pregnancy confirmed. Gestational sac seen in the uterus on OB ultrasound.  Labwork essentially noncontributory. Few clue cells seen on wet prep, no vaginal discharge, patient asymptomatic. Doubt bacterial vaginosis. Urine still pending. Discussed case with Dr. Romeo Apple, at this time patient is medically cleared. Consult placed to TTS       Final diagnoses:  Suicidal ideation  Narcotic abuse  Prior to patient discharge, I discussed and reviewed this case  with Dr.Harrison         Sharlene Motts, PA-C 12/16/13 2336

## 2013-12-17 ENCOUNTER — Inpatient Hospital Stay (HOSPITAL_COMMUNITY)
Admission: EM | Admit: 2013-12-17 | Discharge: 2013-12-24 | DRG: 781 | Disposition: A | Discharge status: Home / Self Care | Payer: Medicare Other | Source: Intra-hospital | Attending: Psychiatry | Admitting: Psychiatry

## 2013-12-17 ENCOUNTER — Encounter (HOSPITAL_COMMUNITY): Payer: Self-pay | Admitting: *Deleted

## 2013-12-17 ENCOUNTER — Encounter (HOSPITAL_COMMUNITY): Payer: Self-pay | Admitting: Emergency Medicine

## 2013-12-17 DIAGNOSIS — J989 Respiratory disorder, unspecified: Secondary | ICD-10-CM | POA: Diagnosis present

## 2013-12-17 DIAGNOSIS — F1110 Opioid abuse, uncomplicated: Secondary | ICD-10-CM | POA: Diagnosis not present

## 2013-12-17 DIAGNOSIS — F19939 Other psychoactive substance use, unspecified with withdrawal, unspecified: Secondary | ICD-10-CM | POA: Diagnosis not present

## 2013-12-17 DIAGNOSIS — O99340 Other mental disorders complicating pregnancy, unspecified trimester: Secondary | ICD-10-CM | POA: Diagnosis present

## 2013-12-17 DIAGNOSIS — O99320 Drug use complicating pregnancy, unspecified trimester: Principal | ICD-10-CM

## 2013-12-17 DIAGNOSIS — F332 Major depressive disorder, recurrent severe without psychotic features: Secondary | ICD-10-CM | POA: Diagnosis present

## 2013-12-17 DIAGNOSIS — R059 Cough, unspecified: Secondary | ICD-10-CM | POA: Diagnosis present

## 2013-12-17 DIAGNOSIS — O99330 Smoking (tobacco) complicating pregnancy, unspecified trimester: Secondary | ICD-10-CM | POA: Diagnosis present

## 2013-12-17 DIAGNOSIS — F19239 Other psychoactive substance dependence with withdrawal, unspecified: Secondary | ICD-10-CM | POA: Diagnosis not present

## 2013-12-17 DIAGNOSIS — R05 Cough: Secondary | ICD-10-CM | POA: Diagnosis present

## 2013-12-17 DIAGNOSIS — K089 Disorder of teeth and supporting structures, unspecified: Secondary | ICD-10-CM | POA: Diagnosis present

## 2013-12-17 DIAGNOSIS — F1120 Opioid dependence, uncomplicated: Secondary | ICD-10-CM | POA: Diagnosis not present

## 2013-12-17 DIAGNOSIS — F1920 Other psychoactive substance dependence, uncomplicated: Principal | ICD-10-CM | POA: Diagnosis present

## 2013-12-17 DIAGNOSIS — F603 Borderline personality disorder: Secondary | ICD-10-CM | POA: Diagnosis present

## 2013-12-17 DIAGNOSIS — F1123 Opioid dependence with withdrawal: Secondary | ICD-10-CM

## 2013-12-17 DIAGNOSIS — F1994 Other psychoactive substance use, unspecified with psychoactive substance-induced mood disorder: Secondary | ICD-10-CM | POA: Diagnosis present

## 2013-12-17 DIAGNOSIS — R45851 Suicidal ideations: Secondary | ICD-10-CM | POA: Diagnosis not present

## 2013-12-17 LAB — CBG MONITORING, ED: Glucose-Capillary: 111 mg/dL — ABNORMAL HIGH (ref 70–99)

## 2013-12-17 LAB — HCG, QUANTITATIVE, PREGNANCY: HCG, BETA CHAIN, QUANT, S: 5071 m[IU]/mL — AB (ref <5)

## 2013-12-17 LAB — GC/CHLAMYDIA PROBE AMP
CT Probe RNA: NEGATIVE
GC Probe RNA: NEGATIVE

## 2013-12-17 LAB — HIV ANTIBODY (ROUTINE TESTING W REFLEX): HIV: NONREACTIVE

## 2013-12-17 LAB — RPR

## 2013-12-17 MED ORDER — PRENATAL MULTIVITAMIN CH
1.0000 | ORAL_TABLET | Freq: Every day | ORAL | Status: DC
  Administered 2013-12-17: 1 via ORAL
  Filled 2013-12-17: qty 1

## 2013-12-17 NOTE — Tx Team (Signed)
Initial Interdisciplinary Treatment Plan   PATIENT STRESSORS: Financial difficulties Marital or family conflict Medication change or noncompliance Occupational concerns Substance abuse   PROBLEM LIST: Problem List/Patient Goals Date to be addressed Date deferred Reason deferred Estimated date of resolution  "mental issues"      "substance abuse"                                                 DISCHARGE CRITERIA:  Improved stabilization in mood, thinking, and/or behavior  PRELIMINARY DISCHARGE PLAN: Return to previous living arrangement  PATIENT/FAMIILY INVOLVEMENT: This treatment plan has been presented to and reviewed with the patient, Christina Burch.  The patient and family have been given the opportunity to ask questions and make suggestions.  [name] 12/17/2013, 10:24 PM

## 2013-12-17 NOTE — Progress Notes (Signed)
Admission: Pt is a 33 year old female admitted involuntarily to [facility] for SI and substance abuse.  Pt reports she is here because of "mental issues and substance abuse.  I'm suicidal, I have borderline personality disorder, I'm psychotic, and substance abuse."  Pt reports she has been pregnant for "3 or 4 weeks."  Pt reports she uses prescription pain medications daily. Pt denies HI and hallucinations, reports SI.  Pt verbally contracts for safety.  Pt reports withdrawal symptoms of "hot/cold sweats, runny nose, nausea."  Pt is irritable and anxious during admission assessment.  Non-invasive body assessment completed, pt has tattoos on right hand and shoulder, a tattoo on her left leg, a scar on her left elbow, and a scar on her abdomen.  Pt oriented to unit, belongings searched, meal and beverage offered.  Pt is in no distress at this time.  Report given to oncoming RN.

## 2013-12-17 NOTE — ED Provider Notes (Signed)
Accepted at [facility] by Dr. Elise Benne, MD 12/17/13 2009

## 2013-12-17 NOTE — ED Notes (Signed)
Magistrate has received papers.  
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
## 2013-12-17 NOTE — ED Notes (Signed)
TTS IN PROGRESS 

## 2013-12-18 DIAGNOSIS — F1120 Opioid dependence, uncomplicated: Secondary | ICD-10-CM | POA: Diagnosis present

## 2013-12-18 LAB — HEMOGLOBIN A1C
Hgb A1c MFr Bld: 5.3 % (ref <5.7)
Mean Plasma Glucose: 105 mg/dL (ref <117)

## 2013-12-18 LAB — IRON AND TIBC
Iron: 47 ug/dL (ref 42–135)
SATURATION RATIOS: 12 % — AB (ref 20–55)
TIBC: 382 ug/dL (ref 250–470)
UIBC: 335 ug/dL (ref 125–400)

## 2013-12-18 LAB — RETICULOCYTES
RBC.: 4.98 MIL/uL (ref 3.87–5.11)
Retic Count, Absolute: 49.8 10*3/uL (ref 19.0–186.0)
Retic Ct Pct: 1 % (ref 0.4–3.1)

## 2013-12-18 LAB — TSH: TSH: 0.526 u[IU]/mL (ref 0.350–4.500)

## 2013-12-18 LAB — FERRITIN: FERRITIN: 14 ng/mL (ref 10–291)

## 2013-12-18 LAB — FOLATE: Folate: 12.6 ng/mL

## 2013-12-18 LAB — VITAMIN B12: Vitamin B-12: 721 pg/mL (ref 211–911)

## 2013-12-18 MED ORDER — IBUPROFEN 600 MG PO TABS
600.0000 mg | ORAL_TABLET | Freq: Four times a day (QID) | ORAL | Status: DC | PRN
  Administered 2013-12-19 – 2013-12-24 (×15): 600 mg via ORAL
  Filled 2013-12-18 (×16): qty 1

## 2013-12-18 MED ORDER — OXYBUTYNIN CHLORIDE 5 MG PO TABS
5.0000 mg | ORAL_TABLET | Freq: Every day | ORAL | Status: DC
  Administered 2013-12-18 – 2013-12-24 (×7): 5 mg via ORAL
  Filled 2013-12-18 (×6): qty 1
  Filled 2013-12-18: qty 4
  Filled 2013-12-18 (×4): qty 1

## 2013-12-18 MED ORDER — BUPRENORPHINE HCL 2 MG SL SUBL
4.0000 mg | SUBLINGUAL_TABLET | Freq: Two times a day (BID) | SUBLINGUAL | Status: DC
Start: 2013-12-18 — End: 2013-12-24
  Administered 2013-12-18 – 2013-12-24 (×13): 4 mg via SUBLINGUAL
  Filled 2013-12-18 (×14): qty 2

## 2013-12-18 MED ORDER — ARIPIPRAZOLE 15 MG PO TABS
15.0000 mg | ORAL_TABLET | Freq: Every day | ORAL | Status: DC
  Administered 2013-12-18 – 2013-12-23 (×6): 15 mg via ORAL
  Filled 2013-12-18 (×2): qty 1
  Filled 2013-12-18: qty 4
  Filled 2013-12-18 (×7): qty 1

## 2013-12-18 MED ORDER — ACETAMINOPHEN 325 MG PO TABS: 650.0000 mg | ORAL_TABLET | Freq: Four times a day (QID) | ORAL | Status: DC | PRN

## 2013-12-18 MED ORDER — GABAPENTIN 600 MG PO TABS: 300.0000 mg | ORAL_TABLET | Freq: Three times a day (TID) | ORAL | Status: DC

## 2013-12-18 MED ORDER — OXCARBAZEPINE 300 MG PO TABS
300.0000 mg | ORAL_TABLET | Freq: Two times a day (BID) | ORAL | Status: DC
  Administered 2013-12-18 – 2013-12-24 (×12): 300 mg via ORAL
  Filled 2013-12-18 (×11): qty 1
  Filled 2013-12-18: qty 8
  Filled 2013-12-18 (×3): qty 1
  Filled 2013-12-18: qty 8
  Filled 2013-12-18: qty 1

## 2013-12-18 MED ORDER — GABAPENTIN 300 MG PO CAPS
300.0000 mg | ORAL_CAPSULE | Freq: Three times a day (TID) | ORAL | Status: DC
  Administered 2013-12-18 – 2013-12-24 (×18): 300 mg via ORAL
  Filled 2013-12-18 (×9): qty 1
  Filled 2013-12-18: qty 12
  Filled 2013-12-18 (×5): qty 1
  Filled 2013-12-18: qty 12
  Filled 2013-12-18 (×2): qty 1
  Filled 2013-12-18: qty 12
  Filled 2013-12-18 (×7): qty 1

## 2013-12-18 NOTE — Progress Notes (Signed)
Patient ID: Christina Burch, female   DOB: Aug 24, 1980, 33 y.o.   MRN: [id number] PER STATE REGULATIONS 482.30  THIS CHART WAS REVIEWED FOR MEDICAL NECESSITY WITH RESPECT TO THE PATIENT'S ADMISSION/ DURATION OF STAY.  NEXT REVIEW DATE:  12/21/2013 Willa Rough, RN, BSN CASE MANAGER

## 2013-12-18 NOTE — [facility] Suicide Risk Assessment (Signed)
Suicide Risk Assessment  Admission Assessment     Nursing information obtained from:  Patient Demographic factors:  Caucasian;Living alone;Unemployed Current Mental Status:  Suicidal ideation indicated by patient Loss Factors:  Financial problems / change in socioeconomic status Historical Factors:  Prior suicide attempts Risk Reduction Factors:  Pregnancy;Responsible for children under 35 years of age Total Time spent with patient: 45 minutes  CLINICAL FACTORS:   Depression:   Comorbid alcohol abuse/dependence Impulsivity Severe Alcohol/Substance Abuse/Dependencies  Psychiatric Specialty Exam:     Blood pressure 134/82, pulse 96, temperature 98.2 F (36.8 C), temperature source Oral, resp. rate 18, height 5' 2.75" (1.594 m), weight 80.287 kg (177 lb), last menstrual period 11/06/2013, SpO2 98.00%.Body mass index is 31.6 kg/(m^2).  General Appearance: Disheveled  Eye Solicitor::  Fair  Speech:  Clear and Coherent  Volume:  fluctuates  Mood:  Anxious, Depressed and in pain "withdrawal"  Affect:  anxious, worried  Thought Process:  Coherent and Goal Directed  Orientation:  Full (Time, Place, and Person)  Thought Content:  symptoms worries concerns  Suicidal Thoughts:  Yes.  without intent/plan  Homicidal Thoughts:  No  Memory:  Immediate;   Fair Recent;   Fair Remote;   Fair  Judgement:  Impaired  Insight:  Lacking  Psychomotor Activity:  Restlessness  Concentration:  Fair  Recall:  [employer] of Knowledge:NA  Language: Fair  Akathisia:  No  Handed:    AIMS (if indicated):     Assets:  Desire for Improvement Housing  Sleep:  Number of Hours: 5.5   Musculoskeletal: Strength & Muscle Tone: within normal limits Gait & Station: normal Patient leans: N/A  COGNITIVE FEATURES THAT CONTRIBUTE TO RISK:  Closed-mindedness Polarized thinking Thought constriction (tunnel vision)    SUICIDE RISK:   Moderate:  Frequent suicidal ideation with limited intensity, and  duration, some specificity in terms of plans, no associated intent, good self-control, limited dysphoria/symptomatology, some risk factors present, and identifiable protective factors, including available and accessible social support.  PLAN OF CARE: Supportive approach/coping skills/relapse prevention                               Detox with Subutex due to her pregnancy                               Reassess and address the co morbidities  I certify that inpatient services furnished can reasonably be expected to improve the patient's condition.  Mathhew Buysse A 12/18/2013, 2:31 PM

## 2013-12-18 NOTE — ED Provider Notes (Signed)
Medical screening examination/treatment/procedure(s) were conducted as a shared visit with non-physician practitioner(s) and myself.  I personally evaluated the patient during the encounter.   EKG Interpretation None      I interviewed and examined the patient. Lungs are CTAB. Cardiac exam wnl. Abdomen soft.  PA performed pelvic exam. LMP was first week of august. Pt found to be pregnant. Pt suicidal and also narcotic abuser. C/o some soreness in uterine and LLQ. VS unremarkable. Denies vag d/c, vag bleeding, dysuria, fever, vomiting, diarrhea. [place] c/w IUP and gestational sac + yolk sac noted. Do not think further workup for ectopic needed given US findings and well appearance of pt. Will IVC pt given narcotic hx and possible flight risk. I discussed options for preventing narcotic withdrawal w/ pharmacy. Will place on oxycodone  q 6hr prn. Will start prenatal vitamins. Will not start her other home psych meds given pregnancy. She is medically cleared.   Purvis Sheffield, MD 12/18/13 [phone]

## 2013-12-18 NOTE — [facility] Group Notes (Signed)
[facility] LCSW Group Therapy 12/18/2013  1:15 PM Type of Therapy: Group Therapy Participation Level: Active  Participation Quality: Attentive, Sharing and Supportive  Affect: Depressed and Flat  Cognitive: Alert and Oriented  Insight: Developing/Improving and Engaged  Engagement in Therapy: Developing/Improving and Engaged  Modes of Intervention: Clarification, Confrontation, Discussion, Education, Exploration, Limit-setting, Orientation, Problem-solving, Rapport Building, Dance movement psychotherapist, Socialization and Support  Summary of Progress/Problems: The topic for group today was emotional regulation. This group focused on both positive and negative emotion identification and allowed group members to process ways to identify feelings, regulate negative emotions, and find healthy ways to manage internal/external emotions. Group members were asked to reflect on a time when their reaction to an emotion led to a negative outcome and explored how alternative responses using emotion regulation would have benefited them. Group members were also asked to discuss a time when emotion regulation was utilized when a negative emotion was experienced. Patient actively listened during group but reported that she did not feel comfortable sharing with group this afternoon.  [name], MSW, [employer] Clinical Social Worker [facility] [phone]

## 2013-12-18 NOTE — [facility] Group Notes (Signed)
[facility] LCSW Aftercare Discharge Planning Group Note  12/18/2013  8:45 AM  Participation Quality: Did Not Attend- patient observed sleeping in bed.  [name], MSW, [employer] Clinical Social Worker [facility] [phone]

## 2013-12-19 DIAGNOSIS — F1110 Opioid abuse, uncomplicated: Secondary | ICD-10-CM

## 2013-12-19 MED ORDER — ENSURE COMPLETE PO LIQD
237.0000 mL | Freq: Three times a day (TID) | ORAL | Status: DC
  Administered 2013-12-19 – 2013-12-23 (×14): 237 mL via ORAL

## 2013-12-19 MED ORDER — ADULT MULTIVITAMIN W/MINERALS CH
1.0000 | ORAL_TABLET | Freq: Every day | ORAL | Status: DC
  Administered 2013-12-20 – 2013-12-24 (×5): 1 via ORAL
  Filled 2013-12-19 (×9): qty 1

## 2013-12-19 NOTE — Progress Notes (Signed)
[facility] MD Progress Note  12/19/2013 4:03 PM Christina Burch  MRN:  [id number] Subjective:  Christina Burch has tolerated the Subutex detox well so far. She is back on her psychotropics. She would like to stay on the Suboxone as it help her to abstain until she could not keep an appointment and she claims was dropped from the clinic. She states that shortly after she came off the Suboxone she relapsed. She still states she wants to terminate the pregnancy. Still with some suicidal ruminations Diagnosis:   DSM5: Depressive Disorders:  Major Depressive Disorder - Severe (296.23) Total Time spent with patient: 30 minutes  Axis I: Substance Induced Mood Disorder            Opioid use disorder severe ADL's:  Intact  Sleep: Fair  Appetite:  Fair  Psychiatric Specialty Exam: Physical Exam  Review of Systems  Constitutional: Positive for malaise/fatigue.  Eyes: Negative.   Respiratory: Negative.   Cardiovascular: Negative.   Gastrointestinal: Negative.   Genitourinary: Negative.   Musculoskeletal: Positive for back pain.  Skin: Negative.   Neurological: Positive for weakness.  Endo/Heme/Allergies: Negative.   Psychiatric/Behavioral: Positive for depression, suicidal ideas and substance abuse. The patient is nervous/anxious.     Blood pressure 94/61, pulse 98, temperature 97.6 F (36.4 C), temperature source Oral, resp. rate 16, height 5' 2.75" (1.594 m), weight 80.287 kg (177 lb), last menstrual period 11/06/2013, SpO2 98.00%.Body mass index is 31.6 kg/(m^2).  General Appearance: Fairly Groomed  Patent attorney::  Fair  Speech:  Clear and Coherent  Volume:  Decreased  Mood:  Anxious and Depressed  Affect:  Restricted  Thought Process:  Coherent and Goal Directed  Orientation:  Full (Time, Place, and Person)  Thought Content:  symptoms plans as she moves on  Suicidal Thoughts:  No  Homicidal Thoughts:  No  Memory:  Immediate;   Fair Recent;   Fair Remote;   Fair  Judgement:  Fair  Insight:   Present  Psychomotor Activity:  Restlessness  Concentration:  Fair  Recall:  [employer] of Knowledge:NA  Language: Fair  Akathisia:  No  Handed:    AIMS (if indicated):     Assets:  Desire for Improvement  Sleep:  Number of Hours: 6.25   Musculoskeletal: Strength & Muscle Tone: within normal limits Gait & Station: normal Patient leans: N/A  Current Medications: Current Facility-Administered Medications  Medication Dose Route Frequency Provider Last Rate Last Dose  . ARIPiprazole (ABILIFY) tablet 15 mg  15 mg Oral Daily Beau Fanny, FNP   15 mg at 12/18/13 1857  . buprenorphine (SUBUTEX) SL tablet 4 mg  4 mg Sublingual BID Rachael Fee, MD   4 mg at 12/19/13 0810  . feeding supplement (ENSURE COMPLETE) (ENSURE COMPLETE) liquid 237 mL  237 mL Oral TID BM Tenny Craw, RD   237 mL at 12/19/13 1553  . gabapentin (NEURONTIN) capsule 300 mg  300 mg Oral TID Rachael Fee, MD   300 mg at 12/19/13 1208  . ibuprofen (ADVIL,MOTRIN) tablet 600 mg  600 mg Oral Q6H PRN Beau Fanny, FNP      . [START ON 12/20/2013] multivitamin with minerals tablet 1 tablet  1 tablet Oral Daily Tenny Craw, RD      . Oxcarbazepine (TRILEPTAL) tablet 300 mg  300 mg Oral BID Beau Fanny, FNP   300 mg at 12/19/13 4098  . oxybutynin (DITROPAN) tablet 5 mg  5 mg Oral Daily Beau Fanny, FNP  5 mg at 12/19/13 1610    Lab Results:  Results for orders placed during the hospital encounter of 12/17/13 (from the past 48 hour(s))  TSH     Status: None   Collection Time    12/18/13  5:35 AM      Result Value Ref Range   TSH 0.526  0.350 - 4.500 uIU/mL   Comment: Performed at [facility]  HEMOGLOBIN A1C     Status: None   Collection Time    12/18/13  5:35 AM      Result Value Ref Range   Hemoglobin A1C 5.3  <5.7 %   Comment: (NOTE)                                                                               According to the ADA Clinical Practice Recommendations for 2011, when      HbA1c is used as a screening test:      >=6.5%   Diagnostic of Diabetes Mellitus               (if abnormal result is confirmed)     5.7-6.4%   Increased risk of developing Diabetes Mellitus     References:Diagnosis and Classification of Diabetes Mellitus,Diabetes     Care,2011,34(Suppl 1):S62-S69 and Standards of Medical Care in             Diabetes - 2011,Diabetes Care,2011,34 (Suppl 1):S11-S61.   Mean Plasma Glucose 105  <117 mg/dL   Comment: Performed at [facility]  VITAMIN B12     Status: None   Collection Time    12/18/13  5:35 AM      Result Value Ref Range   Vitamin B-12 721  211 - 911 pg/mL   Comment: Performed at [facility]  IRON AND TIBC     Status: Abnormal   Collection Time    12/18/13  5:35 AM      Result Value Ref Range   Iron 47  42 - 135 ug/dL   TIBC 960  454 - 098 ug/dL   Saturation Ratios 12 (*) 20 - 55 %   UIBC 335  125 - 400 ug/dL   Comment: Performed at [facility]  FERRITIN     Status: None   Collection Time    12/18/13  5:35 AM      Result Value Ref Range   Ferritin 14  10 - 291 ng/mL   Comment: Performed at [facility]  RETICULOCYTES     Status: None   Collection Time    12/18/13  5:35 AM      Result Value Ref Range   Retic Ct Pct 1.0  0.4 - 3.1 %   RBC. 4.98  3.87 - 5.11 MIL/uL   Retic Count, Manual 49.8  19.0 - 186.0 K/uL   Comment: Performed at [facility]  FOLATE     Status: None   Collection Time    12/18/13  5:35 AM      Result Value Ref Range   Folate 12.6     Comment: (NOTE)     Reference Ranges  Deficient:       0.4 - 3.3 ng/mL            Indeterminate:   3.4 - 5.4 ng/mL            Normal:              > 5.4 ng/mL     Performed at [facility]    Physical Findings: AIMS: Facial and Oral Movements Muscles of Facial Expression: None, normal Lips and Perioral Area: None, normal Jaw: None, normal Tongue: None, normal,Extremity Movements Upper (arms, wrists,  hands, fingers): None, normal Lower (legs, knees, ankles, toes): None, normal, Trunk Movements Neck, shoulders, hips: None, normal, Overall Severity Severity of abnormal movements (highest score from questions above): None, normal Incapacitation due to abnormal movements: None, normal Patient's awareness of abnormal movements (rate only patient's report): No Awareness, Dental Status Current problems with teeth and/or dentures?: No Does patient usually wear dentures?: No  CIWA:  CIWA-Ar Total: 2 COWS:  COWS Total Score: 6  Treatment Plan Summary: Daily contact with patient to assess and evaluate symptoms and progress in treatment Medication management  Plan: Supportive approach/coping skills/relapse prevention           Continue Subutex 4 mg BID and ask to contact  a Suboxone clinic where she can pursue           Suboxone maintenance. She is clear that other wise we will need to wean off the Subutex  Medical Decision Making Problem Points:  Review of psycho-social stressors (1) Data Points:  Review of medication regiment & side effects (2) Review of new medications or change in dosage (2)  I certify that inpatient services furnished can reasonably be expected to improve the patient's condition.   Jonne Rote A 12/19/2013, 4:03 PM

## 2013-12-19 NOTE — [facility] Group Notes (Signed)
[facility] LCSW Group Therapy 12/19/2013  1:15 pm   Type of Therapy: Group Therapy Participation Level: Active  Participation Quality: Attentive, Sharing and Supportive  Affect: Depressed and Flat  Cognitive: Alert and Oriented  Insight: Developing/Improving and Engaged  Engagement in Therapy: Developing/Improving and Engaged  Modes of Intervention: Clarification, Confrontation, Discussion, Education, Exploration, Limit-setting, Orientation, Problem-solving, Rapport Building, Dance movement psychotherapist, Socialization and Support  Summary of Progress/Problems: The topic for group was balance in life. Today's group focused on defining balance in one's own words, identifying things that can knock one off balance, and exploring healthy ways to maintain balance in life. Group members were asked to provide an example of a time when they felt off balance, describe how they handled that situation,and process healthier ways to regain balance in the future. Group members were asked to share the most important tool for maintaining balance that they learned while at [facility] and how they plan to apply this method after discharge. Patient discussed that she would like to focus on her recovery in order to achieve balance in her life. CSW and other group members provided emotional support and encouragement.  [name], MSW, [employer] Clinical Social Worker [facility] [phone]

## 2013-12-19 NOTE — Progress Notes (Signed)
D. Pt has been visible and active in milieu, has attended and participated in various activities this evening. Pt appears depressed on the unit, spoke about how she is pregnant and the guy responsible left when he found out. Pt also spoke about having seven children and the stress that brings her. Pt reports that she believes the medications are helping and received all medications without incident. A. Support and encouragement provided. R. Safety maintained, will continue to monitor.

## 2013-12-19 NOTE — Progress Notes (Signed)
D: Patient presents with anxious affect and mood. She reported on the self inventory sheet that sleep and ability to concentrate are both poor, appetite is fair and energy level is low. Patient rates depression/feelings of hopelessness "10" and anxiety "8-10". She's visible in the milieu, interactive with peers and actively participating in groups. Patient is compliant with medication regimen.  A: Support and encouragement provided to patient. Scheduled medications administered per MD orders. Maintain Q15 minute checks for safety.  R: Patient receptive. Passive SI, but contracts for safety. Denies HI and AVH. Patient remains safe.

## 2013-12-19 NOTE — H&P (Signed)
[facility] Observation unit: HPI/SRA " I have reviewed the assessment from the ED and I agree. Patient details updated to reflect current status." HPI Christina Burch is a 33 y.o. female with a history of borderline personality disorder, previous suicide attempts who comes in for evaluation of suicidal thoughts. She says she has not taken her Abilify or Trileptal for several weeks because she ran out. and has led to her having suicidal thoughts. She does not have a plan in place, but says she may do it the way she has tried in the past which is overdosing on pain medicine. She reports also having tried slitting her wrists in the past. She denies any homicidal ideations. Denies hearing voices or having hallucinations. She reports polysubstance abuse over the past few weeks, but denies benzodiazepine use or alcohol consumption. She says most recently she took 3 hydrocodone yesterday, 10 [name]'s on Thursday and Saturday she took 7 oxycodone all at once. She also reports taking a home pregnancy test yesterday and saying it is positive. She has only taken tylenol cold today, but denies having any cold symptoms.  Denies fever, chest pain, shortness of breath, numbness or weakness  Past Medical History   Diagnosis  Date   .  Borderline personality disorder    .  Suicidal thoughts    .  Addiction to drug     No past surgical history on file.  No family history on file.  History   Substance Use Topics   .  Smoking status:  Current Every Day Smoker   .  Smokeless tobacco:  Not on file   .  Alcohol Use:  No    OB History    Grav  Para  Term  Preterm  Abortions  TAB  SAB  Ect  Mult  Living                  Review of Systems  Constitutional: Negative for fever.  HENT: Negative for sore throat.  Eyes: Negative for visual disturbance.  Respiratory: Negative for shortness of breath.  Cardiovascular: Negative for chest pain.  Gastrointestinal:  Negative for abdominal pain.  Endocrine: Negative for polyuria.  Genitourinary: Negative for dysuria.  Skin: Negative for rash.  Neurological: Negative for headaches.  Psychiatric/Behavioral: Positive for suicidal ideas.   Suicide Risk Assessment  Admission Assessment     Nursing information obtained from:  Patient Demographic factors:  Caucasian;Living alone;Unemployed Current Mental Status:  Suicidal ideation indicated by patient Loss Factors:  Financial problems / change in socioeconomic status Historical Factors:  Prior suicide attempts Risk Reduction Factors:  Pregnancy;Responsible for children under 102 years of age Total Time spent with patient: 30 minutes  CLINICAL FACTORS:   Severe Anxiety and/or Agitation Depression:   Comorbid alcohol abuse/dependence Alcohol/Substance Abuse/Dependencies  Psychiatric Specialty Exam:     Blood pressure 114/62, pulse 75, temperature 98.8 F (37.1 C), temperature source Oral, resp. rate 18, height 5' 2.75" (1.594 m), weight 80.287 kg (177 lb), last menstrual period 11/06/2013, SpO2 98.00%.Body mass index is 31.6 kg/(m^2).  General Appearance: Disheveled  Eye Solicitor::  Fair  Speech:  Clear and Coherent  Volume:  fluctuates  Mood:  Anxious, Dysphoric and Irritable  Affect:  Labile  Thought Process:  Disorganized and Goal Directed  Orientation:  Other:  person, place  Thought Content:  symptoms worries concerns  Suicidal Thoughts:  Yes.  without intent/plan  Homicidal Thoughts:  No  Memory:  Immediate;   Fair Recent;   Fair Remote;  Fair  Judgement:  Fair  Insight:  Present and Shallow  Psychomotor Activity:  Restlessness  Concentration:  Fair  Recall:  [employer] of Knowledge:NA  Language: Fair  Akathisia:  No  Handed:    AIMS (if indicated):     Assets:  Desire for Improvement  Sleep:  Number of Hours: 6.75   Musculoskeletal: Strength & Muscle Tone: within normal limits Gait & Station: normal Patient leans:  N/A  COGNITIVE FEATURES THAT CONTRIBUTE TO RISK:  Closed-mindedness Polarized thinking Thought constriction (tunnel vision)    SUICIDE RISK:   Moderate:  Frequent suicidal ideation with limited intensity, and duration, some specificity in terms of plans, no associated intent, good self-control, limited dysphoria/symptomatology, some risk factors present, and identifiable protective factors, including available and accessible social support       Allergies   Review of patient's allergies indicates no known allergies.  Home Medications    Prior to Admission medications   Medication  Sig  Start Date  End Date  Taking?  Authorizing Provider   Pseudoeph-CPM-DM-APAP (TYLENOL COLD) 30-2-15-325 MG TABS  Take 2 tablets by mouth daily as needed (for cold).    Yes  Historical Provider, MD   amphetamine-dextroamphetamine (ADDERALL XR) 30 MG 24 hr capsule  Take 30 mg by mouth daily.     Historical Provider, MD   ARIPiprazole (ABILIFY) 30 MG tablet  Take 30 mg by mouth daily.     Historical Provider, MD   buprenorphine-naloxone (SUBOXONE) 8-2 MG SUBL SL tablet  Place 0.5 tablets under the tongue 2 (two) times daily.     Historical Provider, MD   gabapentin (NEURONTIN) 600 MG tablet  Take 600 mg by mouth 3 (three) times daily.     Historical Provider, MD   Iron TABS  Take 1 tablet by mouth daily.     Historical Provider, MD   oxcarbazepine (TRILEPTAL) 600 MG tablet  Take 600 mg by mouth 2 (two) times daily.     Historical Provider, MD   oxybutynin (DITROPAN) 5 MG tablet  Take 5 mg by mouth daily.     Historical Provider, MD    BP 147/99  Pulse 97  Temp(Src) 98.2 F (36.8 C) (Oral)  Resp 15  SpO2 99%  LMP 11/06/2013  Physical Exam  Nursing note and vitals reviewed.  Constitutional: She is oriented to person, place, and time. She appears well-developed and well-nourished.  HENT:  Head: Normocephalic and atraumatic.  Mouth/Throat: Oropharynx is clear and moist.  Eyes: Conjunctivae are  normal. Pupils are equal, round, and reactive to light. Right eye exhibits no discharge. Left eye exhibits no discharge. No scleral icterus.  Neck: Neck supple.  Cardiovascular: Normal rate, regular rhythm and normal heart sounds.  Pulmonary/Chest: Effort normal and breath sounds normal. No respiratory distress. She has no wheezes. She has no rales.  Abdominal: Soft. She exhibits no mass. There is no rebound and no guarding.  Mild ttp to suprapubic abdomen  Genitourinary:  Chaperone was present for the entire genital exam. No lesions or rashes appreciated on vulva. Cervix visualized on speculum exam and appropriate cultures sampled. No blood in vaginal vault. No discharge Upon bi manual exam- No TTP of the adnexa, no cervical motion tenderness. No fullness or masses appreciated. No abnormalities appreciated in structural anatomy.  Musculoskeletal: She exhibits no tenderness.  Neurological: She is alert and oriented to person, place, and time.  Cranial Nerves II-XII grossly intact  Skin: Skin is warm and dry. No rash noted.  Psychiatric: She has a normal mood and affect.  Pt smiles inappropriately throughout interview.   ED Course   Procedures (including critical care time)  Labs Review  Labs Reviewed   WET PREP, GENITAL - Abnormal; Notable for the following:    Clue Cells Wet Prep HPF POC  FEW (*)     WBC, Wet Prep HPF POC  FEW (*)     All other components within normal limits   COMPREHENSIVE METABOLIC PANEL - Abnormal; Notable for the following:    Glucose, Bld  102 (*)     BUN  4 (*)     Alkaline Phosphatase  125 (*)     All other components within normal limits   SALICYLATE LEVEL - Abnormal; Notable for the following:    Salicylate Lvl  <2.0 (*)     All other components within normal limits   URINE RAPID DRUG SCREEN (HOSP PERFORMED) - Abnormal; Notable for the following:    Opiates  POSITIVE (*)     All other components within normal limits   POC URINE PREG, ED - Abnormal;  Notable for the following:    Preg Test, Ur  POSITIVE (*)     All other components within normal limits   GC/CHLAMYDIA PROBE AMP   ACETAMINOPHEN LEVEL   CBC   ETHANOL   HCG, QUANTITATIVE, PREGNANCY   URINALYSIS, ROUTINE W REFLEX MICROSCOPIC   RPR   HIV ANTIBODY (ROUTINE TESTING)    Imaging Review  US Ob Comp Less 14 Wks  12/16/2013 CLINICAL DATA: Cramping. Rule out ectopic. Quantitative beta HCG is pending. By LMP the patient is 5 weeks 0 days. EXAM: OBSTETRIC <14 WK [place] AND TRANSVAGINAL OB US TECHNIQUE: Both transabdominal and transvaginal ultrasound examinations were performed for complete evaluation of the gestation as well as the maternal uterus, adnexal regions, and pelvic cul-de-sac. Transvaginal technique was performed to assess early pregnancy. COMPARISON: None applicable FINDINGS: Intrauterine gestational sac: Present Yolk sac: Present Embryo: Not seen Cardiac Activity: Not seen MSD: 8.3 mm 5 w 4 d Maternal uterus/adnexae: No subchorionic hemorrhage identified. Right ovarian cyst measures 2.4 x 2.6 x 2.5 cm and contains internal echoes. The left ovary has a normal appearance and measures 2.2 x 1.6 x 2.3 cm. Small amount of free pelvic fluid is noted. IMPRESSION: 1. Intrauterine gestational sac with yolk sac. 2. Embryo is not yet visible. 3. Followup ultrasound is suggested in 2 weeks to establish presence of embryo and for dating purposes. 4. Probable minimally complex right corpus luteum cyst. Followup is recommended. Electronically Signed By: Rosalie Gums M.D. On: 12/16/2013 21:44  US Ob Transvaginal  12/16/2013 CLINICAL DATA: Cramping. Rule out ectopic. Quantitative beta HCG is pending. By LMP the patient is 5 weeks 0 days. EXAM: OBSTETRIC <14 WK [place] AND TRANSVAGINAL OB US TECHNIQUE: Both transabdominal and transvaginal ultrasound examinations were performed for complete evaluation of the gestation as well as the maternal uterus, adnexal regions, and pelvic cul-de-sac. Transvaginal technique  was performed to assess early pregnancy. COMPARISON: None applicable FINDINGS: Intrauterine gestational sac: Present Yolk sac: Present Embryo: Not seen Cardiac Activity: Not seen MSD: 8.3 mm 5 w 4 d Maternal uterus/adnexae: No subchorionic hemorrhage identified. Right ovarian cyst measures 2.4 x 2.6 x 2.5 cm and contains internal echoes. The left ovary has a normal appearance and measures 2.2 x 1.6 x 2.3 cm. Small amount of free pelvic fluid is noted. IMPRESSION: 1. Intrauterine gestational sac with yolk sac. 2. Embryo is not yet visible. 3. Followup ultrasound  is suggested in 2 weeks to establish presence of embryo and for dating purposes. 4. Probable minimally complex right corpus luteum cyst. Followup is recommended. Electronically Signed By: Rosalie Gums M.D. On: 12/16/2013 21:44   EKG Interpretation  None   Meds given in ED:  Medications   oxyCODONE (Oxy IR/ROXICODONE) immediate release tablet 5 mg (not administered)   acetaminophen (TYLENOL) tablet 650 mg (not administered)   prenatal vitamin w/FE, FA (NATACHEW) chewable tablet 1 tablet (not administered)   diphenhydrAMINE (BENADRYL) capsule 50 mg (50 mg Oral Given 12/16/13 2336)    Plan of care:  will admit to the observation unit. Will assess for detox needs. Given she is pregnant will detox with Subutex. Will reassess and address the co morbidities. Other medications will be resumed if indicated considering risk/benefit   Madie Reno A. Dub Mikes, M.D.

## 2013-12-19 NOTE — Progress Notes (Signed)
Pt attended karaoke group this evening.  

## 2013-12-19 NOTE — Progress Notes (Signed)
NUTRITION ASSESSMENT  Pt identified as at risk on the Malnutrition Screen Tool  INTERVENTION: 1. Educated patient on the importance of nutrition and encouraged intake of food and beverages. 2. Multivitamin 1 tablet PO daily 3. Supplements: Ensure Complete TID 4. Anti-emetics per MD  NUTRITION DIAGNOSIS: Unintentional weight loss related to sub-optimal intake as evidenced by pt report.   Goal: Pt to meet >/= 90% of their estimated nutrition needs.  Monitor:  PO intake  Assessment:  admitted involuntarily to [facility] for SI and substance abuse. Pt reports she is here because of "mental issues and substance abuse. I'm suicidal, I have borderline personality disorder, I'm psychotic, and substance abuse." Pt reports she has been pregnant for "3 or 4 weeks." Pt reports she uses prescription pain medications daily.  - Met with pt who reports being very nauseated every time she eats, denies vomiting, and said this has been going on for the past 4 weeks - Pt suspects she has been losing weight because of this - Doesn't drink water because it makes her sick - Said she had a small broccoli and cheese potato for lunch - Frustrated with feeling like no one is doing anything to control her nausea - Agreeable to getting Ensure Complete, will order   33 y.o. female  Height: Ht Readings from Last 1 Encounters:  12/17/13 5' 2.75" (1.594 m)    Weight: Wt Readings from Last 1 Encounters:  12/17/13 177 lb (80.287 kg)    Weight Hx: Wt Readings from Last 10 Encounters:  12/17/13 177 lb (80.287 kg)    BMI:  Body mass index is 31.6 kg/(m^2). Pt meets criteria for class I obesity based on current BMI.  Estimated Nutritional Needs: Kcal: 25-30 kcal/kg of ideal body weight  Protein: > 1 gram protein/kg Fluid: 1 ml/kcal  Diet Order: General Pt is also offered choice of unit snacks mid-morning and mid-afternoon.  Pt is eating as desired.   Lab results and medications reviewed. Alk phos  elevated.   Carlis Stable MS, [place], LDN [phone] Pager [phone] Weekend/After Hours Pager
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
## 2013-12-19 NOTE — Progress Notes (Signed)
Adult Psychoeducational Group Note  Date:  12/19/2013 Time: 9:00 AM  Group Topic/Focus:  Morning Wellness Group  Participation Level:  Did Not Attend  Melanee Spry 12/19/2013, 12:27 PM

## 2013-12-19 NOTE — Progress Notes (Signed)
Patient ID: Christina Burch, female   DOB: 1981/01/22, 33 y.o.   MRN: [id number] D: client in bed, eyes closed, respirations even. A: Writer observed for s/s of distress, staff will maintain q39min safety checks. R: Client is safe on the unit, respirations unlabored.

## 2013-12-20 MED ORDER — AMOXICILLIN-POT CLAVULANATE 875-125 MG PO TABS
1.0000 | ORAL_TABLET | Freq: Two times a day (BID) | ORAL | Status: DC
  Administered 2013-12-20 – 2013-12-24 (×8): 1 via ORAL
  Filled 2013-12-20: qty 1
  Filled 2013-12-20: qty 6
  Filled 2013-12-20 (×10): qty 1
  Filled 2013-12-20: qty 6
  Filled 2013-12-20: qty 1

## 2013-12-20 MED ORDER — BENZOCAINE 10 % MT GEL
Freq: Four times a day (QID) | OROMUCOSAL | Status: DC | PRN
  Administered 2013-12-20 – 2013-12-23 (×2): via OROMUCOSAL
  Administered 2013-12-23: 1 via OROMUCOSAL
  Filled 2013-12-20: qty 9.4

## 2013-12-20 MED ORDER — DM-GUAIFENESIN ER 30-600 MG PO TB12
1.0000 | ORAL_TABLET | Freq: Two times a day (BID) | ORAL | Status: DC
  Administered 2013-12-20 – 2013-12-24 (×8): 1 via ORAL
  Filled 2013-12-20 (×2): qty 1
  Filled 2013-12-20: qty 6
  Filled 2013-12-20 (×10): qty 1
  Filled 2013-12-20: qty 6
  Filled 2013-12-20: qty 1

## 2013-12-20 NOTE — Progress Notes (Signed)
[facility] MD Progress Note  12/20/2013 5:06 PM Christina Burch  MRN:  [id number] Subjective:  Christina Burch has tolerated the Subutex and for the most part it has taken care of the withdrawal. Still with some aches and pains. She still has the cough that is getting worst productive with green sputum. She is still pretty much firm that she is going to have an abortion. States there is no way she can have this baby. Still feeling quite sad, depressed but thinks that as the withdrawal gets better she is going to feel better. Still with occasional SI with no plans or intent. More so when she thinks about her situation  Diagnosis:   DSM5: Substance/Addictive Disorders:  Opioid Disorder - Severe (304.00) Depressive Disorders:  Major Depressive Disorder - Moderate (296.22) Total Time spent with patient: 30 minutes  Axis I: Substance Induced Mood Disorder  ADL's:  Intact  Sleep: Poor  Appetite:  Fair  Psychiatric Specialty Exam: Physical Exam  Review of Systems  Constitutional: Positive for malaise/fatigue.  HENT: Negative.   Eyes: Negative.   Respiratory: Positive for cough.   Cardiovascular: Negative.   Gastrointestinal: Negative.   Musculoskeletal: Positive for myalgias.  Skin: Negative.   Neurological: Positive for weakness.  Endo/Heme/Allergies: Negative.   Psychiatric/Behavioral: Positive for depression, hallucinations and substance abuse.    Blood pressure 99/70, pulse 101, temperature 99.1 F (37.3 C), temperature source Oral, resp. rate 16, height 5' 2.75" (1.594 m), weight 80.287 kg (177 lb), last menstrual period 11/06/2013, SpO2 98.00%.Body mass index is 31.6 kg/(m^2).  General Appearance: Fairly Groomed  Patent attorney::  Fair  Speech:  Clear and Coherent  Volume:  Decreased  Mood:  Anxious, Depressed and "feels sick"  Affect:  anxious, worried  Thought Process:  Coherent and Goal Directed  Orientation:  Full (Time, Place, and Person)  Thought Content:  symptoms worries concerns   Suicidal Thoughts:  No  Homicidal Thoughts:  No  Memory:  Immediate;   Fair Recent;   Fair Remote;   Fair  Judgement:  Fair  Insight:  Present  Psychomotor Activity:  Restlessness  Concentration:  Fair  Recall:  [employer] of Knowledge:NA  Language: Fair  Akathisia:  No  Handed:    AIMS (if indicated):     Assets:  Desire for Improvement  Sleep:  Number of Hours: 3.5   Musculoskeletal: Strength & Muscle Tone: within normal limits Gait & Station: normal Patient leans: N/A  Current Medications: Current Facility-Administered Medications  Medication Dose Route Frequency Provider Last Rate Last Dose  . amoxicillin-clavulanate (AUGMENTIN) 875-125 MG per tablet 1 tablet  1 tablet Oral Q12H Rachael Fee, MD      . ARIPiprazole (ABILIFY) tablet 15 mg  15 mg Oral Daily Beau Fanny, FNP   15 mg at 12/19/13 2009  . buprenorphine (SUBUTEX) SL tablet 4 mg  4 mg Sublingual BID Rachael Fee, MD   4 mg at 12/20/13 0816  . dextromethorphan-guaiFENesin (MUCINEX DM) 30-600 MG per 12 hr tablet 1 tablet  1 tablet Oral BID Rachael Fee, MD   1 tablet at 12/20/13 1623  . feeding supplement (ENSURE COMPLETE) (ENSURE COMPLETE) liquid 237 mL  237 mL Oral TID BM Tenny Craw, RD   237 mL at 12/20/13 1624  . gabapentin (NEURONTIN) capsule 300 mg  300 mg Oral TID Rachael Fee, MD   300 mg at 12/20/13 1623  . ibuprofen (ADVIL,MOTRIN) tablet 600 mg  600 mg Oral Q6H PRN Beau Fanny,  FNP   600 mg at 12/20/13 0422  . multivitamin with minerals tablet 1 tablet  1 tablet Oral Daily Tenny Craw, RD   1 tablet at 12/20/13 0816  . Oxcarbazepine (TRILEPTAL) tablet 300 mg  300 mg Oral BID Beau Fanny, FNP   300 mg at 12/20/13 1623  . oxybutynin (DITROPAN) tablet 5 mg  5 mg Oral Daily Beau Fanny, FNP   5 mg at 12/20/13 1478    Lab Results: No results found for this or any previous visit (from the past 48 hour(s)).  Physical Findings: AIMS: Facial and Oral Movements Muscles of Facial  Expression: None, normal Lips and Perioral Area: None, normal Jaw: None, normal Tongue: None, normal,Extremity Movements Upper (arms, wrists, hands, fingers): None, normal Lower (legs, knees, ankles, toes): None, normal, Trunk Movements Neck, shoulders, hips: None, normal, Overall Severity Severity of abnormal movements (highest score from questions above): None, normal Incapacitation due to abnormal movements: None, normal Patient's awareness of abnormal movements (rate only patient's report): No Awareness, Dental Status Current problems with teeth and/or dentures?: No Does patient usually wear dentures?: No  CIWA:  CIWA-Ar Total: 2 COWS:  COWS Total Score: 6  Treatment Plan Summary: Daily contact with patient to assess and evaluate symptoms and progress in treatment Medication management  Plan: Supportive approach/coping skills/relapse prevention           Continue to stabilize with psychotropics           Address the cough/congestion           She states she has secured an appointment for a Suboxone Clinic so she will like to be able to stay on it            States with all that is going on right now she would be afraid of relapsing off the Suboxone just like it happen last time Medical Decision Making Problem Points:  Review of psycho-social stressors (1) Data Points:  Review of medication regiment & side effects (2) Review of new medications or change in dosage (2)  I certify that inpatient services furnished can reasonably be expected to improve the patient's condition.   Conna Terada A 12/20/2013, 5:06 PM

## 2013-12-20 NOTE — BHH Group Notes (Signed)
Adult Psychoeducational Group Note  Date:  12/20/2013 Time:  10:01 PM  Group Topic/Focus:  AA Meeting  Participation Level:  Did Not Attend  Participation Quality:  None  Affect:  None  Cognitive:  None  Insight: None  Engagement in Group:  None  Modes of Intervention:  Discussion and Education  Additional Comments:  Nadyne didn't attend group.  Caroll Rancher A 12/20/2013, 10:01 PM

## 2013-12-20 NOTE — Progress Notes (Signed)
Patient ID: Christina Burch, female   DOB: Apr 14, 1980, 33 y.o.   MRN: [id number] Received report from B. McNichols D: Client in bed, respiration even. A: Writer assess for s/s of distress. Staff will monitor q16min for safety.R: No distress noted.Client is safe on unit.

## 2013-12-20 NOTE — BHH Group Notes (Signed)
BHH LCSW Group Therapy  .  12/20/2013 3:26 PM  Type of Therapy:  Group Therapy  Participation Level:  Did Not Attend - no explanation given   Wynn Banker 12/20/2013, 3:26 PM

## 2013-12-20 NOTE — Clinical Social Work Note (Signed)
CSW left voicemail for patient's mother Christina Burch, awaiting return call.  [name], MSW, [employer] Clinical Social Worker [facility] [phone]

## 2013-12-20 NOTE — Tx Team (Signed)
Interdisciplinary Treatment Plan Update (Adult) Date: 12/20/2013   Time Reviewed: 9:30 AM  Progress in Treatment: Attending groups: Minimal Participating in groups: Yes Taking medication as prescribed: Yes Tolerating medication: Yes Family/Significant other contact made: CSW continuing to assess Patient understands diagnosis: Yes Discussing patient identified problems/goals with staff: Yes Medical problems stabilized or resolved: Yes Denies suicidal/homicidal ideation: Yes Issues/concerns per patient self-inventory: Yes Other:  New problem(s) identified: N/A  Discharge Plan or Barriers: Patient discharge anticipated for Monday 12/23/13. She is to follow up with [facility] outpatient services for suboxone/subutex maintenance. CSW attempting to arrange medicaid transport for patient at discharge. She will return to her private residence.  Reason for Continuation of Hospitalization:  Depression Anxiety Medication Stabilization   Comments: N/A  Estimated length of stay: Discharge anticipated for Monday 12/23/13.  For review of initial/current patient goals, please see plan of care. Patient and CSW reviewed pt's identified goals and treatment plan. Pt verbalized understanding and agreed to treatment plan.   Attendees: Patient:    Family:    Physician: Dr. Jama Flavors; Dr. Dub Mikes 12/20/2013 9:30 AM  Nursing: Shelda Jakes , RN 12/20/2013 9:30 AM  Clinical Social Worker: Belenda Cruise [place] B[place],  LCSWA 12/20/2013 9:30 AM  Other: Juline Patch, LCSW 12/20/2013 9:30 AM  Other: Leisa Lenz, Vesta Mixer Liaison 12/20/2013 9:30 AM  Other: Onnie Boer, Case Manager 12/20/2013 9:30 AM  Other: [place], LCSW  12/20/2013 9:30 AM  Other:    Other:    Other:    Other:    Other:        Scribe for Treatment Team:  [name], MSW, [employer] [phone]

## 2013-12-20 NOTE — BHH Group Notes (Signed)
   [facility] LCSW Aftercare Discharge Planning Group Note  12/20/2013  8:45 AM   Participation Quality: Alert, Appropriate and Oriented  Mood/Affect: Depressed and Flat  Depression Rating: 6/7  Anxiety Rating: 5  Thoughts of Suicide: Pt denies SI/HI  Will you contract for safety? Yes  Current AVH: Pt denies  Plan for Discharge/Comments: Pt attended discharge planning group and actively participated in group. CSW provided pt with today's workbook. Patient reports that she continues to feel sick and is unable to eat. She is agreeable to subutex maintenance in [place] or [employer].   [employer]: Pt denies access to transportation  Supports: No supports mentioned at this time  [name], MSW, [employer] Clinical Social Worker [employer] [phone]

## 2013-12-20 NOTE — Clinical Social Work Note (Signed)
CSW contacted [employer]. DSS regarding arranging transportation home on Monday at discharge. CSW to call back Monday morning.  [name], MSW, [employer] Clinical Social Worker [facility] [phone]

## 2013-12-20 NOTE — Clinical Social Work Note (Signed)
CSW has contacted the following agencies regarding suboxone maintenance:  [facility]- no maintenance program  [facility]- no suboxone treatment, only methadone  [facility] Orvan Falconer at [facility]- not taking new clients until Sept. 28th, patient will have to call to schedule appointment.  [facility]- walk-in service only  [facility]- accepts medicaid, patient has to call on Monday to schedule an appointment.    [name], MSW, [employer] Clinical Social Worker [facility] [phone]

## 2013-12-20 NOTE — Progress Notes (Signed)
D.  Pt pleasant on approach, only complaint tooth pain for which she is receiving antibiotic, ibuprofen, and orajel.  Denies SI/HI/hallucinations at this time.  Pt did not attend evening group due to tooth pain, remained in bed.  A.  Support and encouragement offered.  Medication given as ordered  R.  Pt remains safe on unit, will continue to monitor.

## 2013-12-20 NOTE — Progress Notes (Signed)
Nursing Shift Note D: pt reports she slept poorly, good appetite, rated depression #7, rated anxiety #5, complains of hot sweats, and back pain #7. Pt daily goal is to get rest. A: medication administered per MD order. Support and encouragement given. R: pt is safe and in a therapeutic milieu. Will continue to follow treatment plan.

## 2013-12-21 DIAGNOSIS — F1120 Opioid dependence, uncomplicated: Secondary | ICD-10-CM

## 2013-12-21 DIAGNOSIS — F1994 Other psychoactive substance use, unspecified with psychoactive substance-induced mood disorder: Secondary | ICD-10-CM

## 2013-12-21 LAB — VITAMIN D 1,25 DIHYDROXY
Vitamin D 1, 25 (OH)2 Total: 72 pg/mL (ref 18–72)
Vitamin D3 1, 25 (OH)2: 72 pg/mL

## 2013-12-21 MED ORDER — ALBUTEROL SULFATE HFA 108 (90 BASE) MCG/ACT IN AERS
2.0000 | INHALATION_SPRAY | Freq: Four times a day (QID) | RESPIRATORY_TRACT | Status: DC | PRN
  Administered 2013-12-21 – 2013-12-24 (×3): 2 via RESPIRATORY_TRACT
  Filled 2013-12-21: qty 6.7

## 2013-12-21 NOTE — BHH Group Notes (Signed)
BHH Group Notes:  Goals group Date:  12/21/2013  Time:  9:45 AM  Type of Therapy:  Nurse Education  Participation Level:  Did Not Attend  Participation Quality:  Inattentive  Affect:  Flat  Cognitive:  Lacking  Insight:  None  Engagement in Group:  None  Modes of Intervention:  Discussion  Summary of Progress/Problems:Pt did not attend group  Rodman Key [facility] 12/21/2013, 9:45 AM

## 2013-12-21 NOTE — Progress Notes (Signed)
Attended group 

## 2013-12-21 NOTE — Plan of Care (Signed)
Problem: Diagnosis: Increased Risk For Suicide Attempt Goal: LTG-Patient Will Report Improved Mood and Deny Suicidal LTG (by discharge) Patient will report improved mood and deny suicidal ideation.  Outcome: Progressing Pt denies suicidal ideation.  Mood pleasant except for pain from toothache
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
## 2013-12-21 NOTE — Progress Notes (Addendum)
Pt stated she continues to have tooth pain and was given orajel for this. She presented to the med window dishelved and stated,"I am tired I need to go back to bed." Pt does contract for safety and denies SI and HI. Pt did not attend group and presently is in the bed.Pt was given her albuterol inhaler to breathe better at 11:30am. Pt wants to take her meds ,get rest and go to meetings. She rates her depression a 7-8/10. 4pm - pt was given  of motrin for her tooth pain a 7/10.

## 2013-12-21 NOTE — [facility] Group Notes (Signed)
[facility] Group Notes:  (Clinical Social Work)  12/21/2013     10-11AM  Summary of Progress/Problems:   The main focus of today's process group was to learn how to use a decisional balance exercise to move forward in the Stages of Change, which were described and discussed.  Motivational Interviewing and a worksheet were utilized to help patients explore in depth the perceived benefits and costs of a self-sabotaging behavior, as well as the  benefits and costs of replacing that with a healthy coping mechanism.   The patient expressed no independent thoughts during group.  She was late to group by about 15 minutes, and sat quietly but with eyes tracking the discussion for the remainder of group.  Type of Therapy:  Group Therapy - Process   Participation Level:  Minimal  Participation Quality:  Attentive  Affect:  Flat  Cognitive:  Alert  Insight:  Improving  Engagement in Therapy:  Improving  Modes of Intervention:  Education, Motivational Interviewing  Ambrose Mantle, LCSW 12/21/2013, 12:51 PM

## 2013-12-21 NOTE — [facility] Group Notes (Signed)
[facility] Group Notes:  Healthy Coping skills  Date:  12/21/2013  Time:  2:39 PM  Type of Therapy:  Nurse Education  Participation Level:  Active  Participation Quality:  Appropriate  Affect:  Appropriate  Cognitive:  Alert  Insight:  Appropriate  Engagement in Group:  Engaged  Modes of Intervention:  Discussion  Summary of Progress/Problems:  Nicole Cella 12/21/2013, 2:39 PM

## 2013-12-21 NOTE — [facility] Group Notes (Signed)
[facility] Group Notes: Goals group  Date:  12/21/2013  Time:  9:53 AM  Type of Therapy:  Nurse Education  Participation Level:  Did Not Attend  Participation Quality:  Inattentive  Affect:  Flat  Cognitive:  Lacking  Insight:  None  Engagement in Group:  Lacking  Modes of Intervention:  Discussion  Summary of Progress/Problems:Pt did not attend  Rodman Key [facility] 12/21/2013, 9:53 AM

## 2013-12-22 NOTE — Progress Notes (Signed)
Patient is flat in affect with depressed mood. When asked about her mood patient states, "I'm ok, I'm just really tired." She continues to have pain from a broken tooth on her upper L side and is medicated with advil. She rates her pain as an 8/10. Pt given support and encouragement. She denies SI/HI and is hopeful for discharge tomorrow. Christina Burch

## 2013-12-22 NOTE — Progress Notes (Signed)
Attended group 

## 2013-12-22 NOTE — Progress Notes (Signed)
Spoke with patient who is flat and depressed in affect with congruent mood however is pleasant when engaged with this Clinical research associate. She complains of tooth pain and states the motrin we've given her has helped. No other complaints voiced. Given motrin with good relief. Supported and reassured. She attended group tonight. No SI/HI expressed and she remains safe. Lawrence Marse[place]

## 2013-12-22 NOTE — Progress Notes (Signed)
Psychoeducational Group Note  Date:  12/22/2013 Time:  1315  Group Topic/Focus:  Making Healthy Choices:   The focus of this group is to help patients identify negative/unhealthy choices they were using prior to admission and identify positive/healthier coping strategies to replace them upon discharge.  Participation Level:  Active  Participation Quality:  Drowsy  Affect:  Flat  Cognitive:  Oriented  Insight:  Lacking  Engagement in Group:  sleepy  Additional Comments:  Pt was sleepy this afternoon and was having problems staying awake in the group.  Christina Burch A 12/22/2013  

## 2013-12-22 NOTE — Plan of Care (Signed)
Problem: Alteration in mood & ability to function due to Goal: STG: Patient verbalizes decreases in signs of withdrawal Outcome: Progressing Other than persistent fatigue, patient is denying withdrawal symptoms.  Problem: Alteration in mood Goal: STG-Patient reports thoughts of self-harm to staff Outcome: Progressing Patient is denying SI and has not engaged in any self harm while on the unit.

## 2013-12-22 NOTE — Progress Notes (Signed)
[facility] MD Progress Note  12/22/2013 3:09 PM Christina Burch  MRN:  [id number] Subjective:  Christina Burch is observed resting in her bed. She is compliant with her medication and treatment regimen at this time. She states she is doing ok but she is sleeping a lot. She was able to speak with her sister and they were able to resolve the issues about her phone. Her cough has improved with antibiotic and albuterol use prn.   She currently rates her depression 2-3/10, anxiety 6-7/10, and hopelessness 10/10. She endorses high levels of worrying and hopeless, because she doesn't know what will happen when she gets out of here.  Denies SI/HI/AVH. She is not anticipating discharge tomorrow as she does not feel like she is ready, she wants to go see a therapist and psychiatrist. She wants to be able to get her kids back and feel like she has accomplished something while being here.   Diagnosis:   DSM5: Substance/Addictive Disorders:  Opioid Disorder - Severe (304.00) Depressive Disorders:  Major Depressive Disorder - Moderate (296.22) Total Time spent with patient: 30 minutes  Axis I: Substance Induced Mood Disorder  ADL's:  Intact  Sleep: Good  Appetite:  Fair  Psychiatric Specialty Exam: Physical Exam  Constitutional: She is oriented to person, place, and time. She appears well-developed. No distress.  Respiratory: Effort normal. No respiratory distress. She has no wheezes (audile wheezing). She has no rales. She exhibits no tenderness.  Musculoskeletal: Normal range of motion.  Neurological: She is alert and oriented to person, place, and time.  Skin: Skin is warm.    Review of Systems  Constitutional: Positive for fever, chills, malaise/fatigue and diaphoresis.  HENT: Negative.   Eyes: Negative.   Respiratory: Positive for cough, sputum production (green) and wheezing (chest tightness).   Cardiovascular: Negative.   Gastrointestinal: Negative.   Musculoskeletal: Positive for myalgias.  Skin:  Negative.   Neurological: Positive for weakness.  Endo/Heme/Allergies: Negative.   Psychiatric/Behavioral: Positive for depression, hallucinations and substance abuse.    Blood pressure 122/77, pulse 100, temperature 98 F (36.7 C), temperature source Oral, resp. rate 18, height 5' 2.75" (1.594 m), weight 80.287 kg (177 lb), last menstrual period 11/06/2013, SpO2 98.00%.Body mass index is 31.6 kg/(m^2).  General Appearance: Fairly Groomed  Patent attorney::  Fair  Speech:  Clear and Coherent  Volume:  Normal  Mood:  Anxious, Depressed and "feels sick"  Affect:  anxious, worried  Thought Process:  Coherent and Goal Directed  Orientation:  Full (Time, Place, and Person)  Thought Content:  symptoms worries concerns  Suicidal Thoughts:  No  Homicidal Thoughts:  No  Memory:  Immediate;   Fair Recent;   Fair Remote;   Fair  Judgement:  Fair  Insight:  Present  Psychomotor Activity:  Restlessness  Concentration:  Fair  Recall:  [employer] of Knowledge:NA  Language: Fair  Akathisia:  No  Handed:    AIMS (if indicated):     Assets:  Desire for Improvement  Sleep:  Number of Hours: 4.5   Musculoskeletal: Strength & Muscle Tone: within normal limits Gait & Station: normal Patient leans: N/A  Current Medications: Current Facility-Administered Medications  Medication Dose Route Frequency Provider Last Rate Last Dose  . albuterol (PROVENTIL HFA;VENTOLIN HFA) 108 (90 BASE) MCG/ACT inhaler 2 puff  2 puff Inhalation Q6H PRN Truman Hayward, FNP   2 puff at 12/21/13 1120  . amoxicillin-clavulanate (AUGMENTIN) 875-125 MG per tablet 1 tablet  1 tablet Oral Q12H Rachael Fee,  MD   1 tablet at 12/22/13 0749  . ARIPiprazole (ABILIFY) tablet 15 mg  15 mg Oral Daily Beau Fanny, FNP   15 mg at 12/21/13 1936  . benzocaine (ORAJEL) 10 % mucosal gel   Mouth/Throat QID PRN Rachael Fee, MD      . buprenorphine (SUBUTEX) SL tablet 4 mg  4 mg Sublingual BID Rachael Fee, MD   4 mg at 12/22/13 0749   . dextromethorphan-guaiFENesin (MUCINEX DM) 30-600 MG per 12 hr tablet 1 tablet  1 tablet Oral BID Rachael Fee, MD   1 tablet at 12/22/13 0749  . feeding supplement (ENSURE COMPLETE) (ENSURE COMPLETE) liquid 237 mL  237 mL Oral TID BM Tenny Craw, RD   237 mL at 12/22/13 0955  . gabapentin (NEURONTIN) capsule 300 mg  300 mg Oral TID Rachael Fee, MD   300 mg at 12/22/13 1159  . ibuprofen (ADVIL,MOTRIN) tablet 600 mg  600 mg Oral Q6H PRN Beau Fanny, FNP   600 mg at 12/22/13 1317  . multivitamin with minerals tablet 1 tablet  1 tablet Oral Daily Tenny Craw, RD   1 tablet at 12/22/13 0753  . Oxcarbazepine (TRILEPTAL) tablet 300 mg  300 mg Oral BID Beau Fanny, FNP   300 mg at 12/22/13 0751  . oxybutynin (DITROPAN) tablet 5 mg  5 mg Oral Daily Beau Fanny, FNP   5 mg at 12/22/13 1610    Lab Results: No results found for this or any previous visit (from the past 48 hour(s)).  Physical Findings: AIMS: Facial and Oral Movements Muscles of Facial Expression: None, normal Lips and Perioral Area: None, normal Jaw: None, normal Tongue: None, normal,Extremity Movements Upper (arms, wrists, hands, fingers): None, normal Lower (legs, knees, ankles, toes): None, normal, Trunk Movements Neck, shoulders, hips: None, normal, Overall Severity Severity of abnormal movements (highest score from questions above): None, normal Incapacitation due to abnormal movements: None, normal Patient's awareness of abnormal movements (rate only patient's report): No Awareness, Dental Status Current problems with teeth and/or dentures?: No Does patient usually wear dentures?: No  CIWA:  CIWA-Ar Total: 2 COWS:  COWS Total Score: 6  Treatment Plan Summary: Daily contact with patient to assess and evaluate symptoms and progress in treatment Medication management  Plan: Supportive approach/coping skills/relapse prevention           Continue to stabilize with psychotropics           Address the  cough/congestion           She states she has secured an appointment for a Suboxone Clinic so she will like to be able to stay on it            States with all that is going on right now she would be afraid of relapsing off the Suboxone just like it happen last time  Will continue Augmentin for respiratory illness. Will continue albuterol 2puffs po Q6hr prn for sob, wheezing, and chest tightness. Pt encouraged to increase water intake to aide with mucous thinning.   Medical Decision Making Problem Points:  Review of psycho-social stressors (1) Data Points:  Review of medication regiment & side effects (2) Review of new medications or change in dosage (2)  I certify that inpatient services furnished can reasonably be expected to improve the patient's condition.   Malachy Chamber S FNP-BC 12/22/2013, 3:09 PM  I agreed with the findings, treatment and disposition plan of this patient. Kathi Ludwig  Adele Schilder, MD

## 2013-12-22 NOTE — [facility] Group Notes (Addendum)
[facility] Group Notes:  (Clinical Social Work)  12/22/2013  10:00-11:00AM  Summary of Progress/Problems:   The main focus of today's process group was to   1)  discuss the importance of adding supports  2)  define health supports versus unhealthy supports  3)  identify the patient's current unhealthy supports and plan how to handle them  4)  Identify the patient's current healthy supports and plan what to add.  An emphasis was placed on using counselor, doctor, therapy groups, 12-step groups, and problem-specific support groups to expand supports.    The patient expressed full comprehension of the concepts presented, and agreed that there is a need to add more supports.  The patient was drowsy and dozed off several times during group, but also was listening at times when her eyes were closed, and would volunteer comments, observations to other patients.  She left prior to the end of group.  Type of Therapy:  Process Group with Motivational Interviewing  Participation Level:  Active  Participation Quality:  Drowsy and Supportive  Affect:  Blunted  Cognitive:  Appropriate  Insight:  Developing/Improving  Engagement in Therapy:  Limited  Modes of Intervention:   Education, Support and Processing, Activity  [employer], LCSW 12/22/2013, 12:15pm

## 2013-12-22 NOTE — Plan of Care (Signed)
Problem: Alteration in mood & ability to function due to Goal: STG-Pt will be introduced to the 12-step program of recovery (Patient will be introduced to the 12-step program of recovery and disease concept of addiction)  Outcome: Progressing Patient attended AA this evening.  Problem: Diagnosis: Increased Risk For Suicide Attempt Goal: STG-Patient Will Comply With Medication Regime Outcome: Progressing Patient has been med compliant and requests prn meds when appropriate.

## 2013-12-22 NOTE — Progress Notes (Signed)
Patient ID: Christina Burch, female   DOB: 1980-10-19, 33 y.o.   MRN: [id number] D: patient is pleasant upon approach; she interacts well with staff and peers.  She continues to report depressive symptoms, rating her depression a 7.  She also rates her hopelessness as a 7.  She continues to report anxiety, rating it as a 6.  Patient reports withdrawal symptoms of cravings, runny nose and irritability.  She took ibuprofen earlier for on-going tooth pain.  Her goal today is "to get a shower and try to stay up."  Patient is sleeping and eating well.  Patient plans to have an abortion on 10/01 as she currently has 7 children and does not feel she can have another.  She denies any SI/HI/AVH. A: Continue to monitor medication management and MD orders.  Safety checks completed every 15 minutes per protocol.  Encourage patient to attend groups and participate in her treatment. R: Patient is cooperative; her behavior is appropriate to situation.

## 2013-12-23 NOTE — Clinical Social Work Note (Signed)
At patient request, CSW faxed letter to Verdie Shire. D.A.'s office and Dagoberto Reef of [address].  [name], MSW, [employer] Clinical Social Worker [facility] [phone]

## 2013-12-23 NOTE — [facility] Suicide Risk Assessment (Signed)
[facility] INPATIENT:  Family/Significant Other Suicide Prevention Education  Suicide Prevention Education:  Contact Attempts: Mother Christina Burch [phone]  (name of family member/significant other) has been identified by the patient as the family member/significant other with whom the patient will be residing, and identified as the person(s) who will aid the patient in the event of a mental health crisis.  With written consent from the patient, two attempts were made to provide suicide prevention education, prior to and/or following the patient's discharge.  We were unsuccessful in providing suicide prevention education.  A suicide education pamphlet was given to the patient to share with family/significant other.  Date and time of first attempt: 12/20/13 / 6:05 pm Date and time of second attempt: 12/23/13 / 10: 48 am  Christina Burch, [place] 12/23/2013, 10:49 AM

## 2013-12-23 NOTE — Progress Notes (Signed)
Chaplain consulted with pt on referral from dietary.  Pt with seven children who are staying with pt's mother.  Pt currently pregnant and contemplating termination.    Pt lying in bed upon chaplain arrival.  Introduced spiritual care as resource.  Pt lethargic and with tooth ache.  She was welcoming of chaplain presence, did not express needs for support at this time.  Chaplain spoke with pt about discharge plans.  Will follow up in morning for continued presence.    Christina Burch MDiv

## 2013-12-23 NOTE — Clinical Social Work Note (Signed)
CSW left voicemail for patient's DSS worker Adriana Simas [phone].  [name], MSW, [employer] Clinical Social Worker [facility] [phone]

## 2013-12-23 NOTE — Progress Notes (Signed)
D: Patient in the hallway on approach.  Patient states she had a good day today.  Patient states her goal for today was to get better.  Patient states she is supposed to be discharged  Tomorrow so patient states she feels confident she met her goal for today.  Patient denies SI/HI and denies AVH.   A: Staff to monitor Q 15 mins for safety.  Encouragement and support offered.  Scheduled medications administered per orders.  Orajel administered prn for toothache. R: Patient remains safe on the unit.  Patient did not attend group tonight.  Patient visible on the unit tonight.  Patient taking administered medication.

## 2013-12-23 NOTE — [facility] Group Notes (Signed)
[facility] LCSW Aftercare Discharge Planning Group Note  12/23/2013  8:45 AM  Participation Quality: Did Not Attend- patient reports that she is feeling unwell and has a tooth ache.  [name], MSW, [employer] Clinical Social Worker [facility] [phone]

## 2013-12-23 NOTE — Clinical Social Work Note (Signed)
Referral faxed to [facility] in [facility] for review to schedule upcoming outpatient appointment.  [name], MSW, [employer] Clinical Social Worker [facility] [phone]

## 2013-12-23 NOTE — Progress Notes (Signed)
[facility] MD Progress Note  12/23/2013 3:50 PM Christina Burch  MRN:  [id number] Subjective:  States she is trying to do the best she can. She is still trying to arrange to be on Suboxone maintenance. She states she is still committed to getting an abortion as she cant have this baby. She wants to be sure she can continue to take her medications once she is D/C. There is some discomfort, some irritability admits she tends to isolate C/O pain in her tooth Diagnosis:   DSM5: Substance/Addictive Disorders:  Opioid Disorder - Severe (304.00) Depressive Disorders:  Major Depressive Disorder - Severe (296.23) Total Time spent with patient: 30 minutes  Axis I: Substance Induced Mood Disorder  ADL's:  Intact  Sleep: Fair  Appetite:  Fair  Psychiatric Specialty Exam: Physical Exam  Review of Systems  Constitutional: Positive for malaise/fatigue.  HENT: Negative.   Eyes: Negative.   Respiratory: Negative.   Cardiovascular: Negative.   Gastrointestinal: Negative.   Genitourinary: Negative.   Musculoskeletal: Negative.   Skin: Negative.   Neurological: Negative.   Endo/Heme/Allergies: Negative.   Psychiatric/Behavioral: Positive for depression and substance abuse. The patient is nervous/anxious.     Blood pressure 108/69, pulse 115, temperature 98.9 F (37.2 C), temperature source Oral, resp. rate 16, height 5' 2.75" (1.594 m), weight 80.287 kg (177 lb), last menstrual period 11/06/2013, SpO2 98.00%.Body mass index is 31.6 kg/(m^2).  General Appearance: Fairly Groomed  Patent attorney::  Fair  Speech:  Clear and Coherent and not spontaneous  Volume:  Decreased  Mood:  Anxious and Depressed  Affect:  Restricted  Thought Process:  Coherent and Goal Directed  Orientation:  Full (Time, Place, and Person)  Thought Content:  events symptoms worries concerns  Suicidal Thoughts:  No  Homicidal Thoughts:  No  Memory:  Immediate;   Fair Recent;   Fair Remote;   Fair  Judgement:  Fair  Insight:   Present and Shallow  Psychomotor Activity:  Restlessness  Concentration:  Fair  Recall:  [employer] of Knowledge:NA  Language: Fair  Akathisia:  No  Handed:    AIMS (if indicated):     Assets:  Desire for Improvement  Sleep:  Number of Hours: 5.25   Musculoskeletal: Strength & Muscle Tone: within normal limits Gait & Station: normal Patient leans: N/A  Current Medications: Current Facility-Administered Medications  Medication Dose Route Frequency Provider Last Rate Last Dose  . albuterol (PROVENTIL HFA;VENTOLIN HFA) 108 (90 BASE) MCG/ACT inhaler 2 puff  2 puff Inhalation Q6H PRN Truman Hayward, FNP   2 puff at 12/23/13 0345  . amoxicillin-clavulanate (AUGMENTIN) 875-125 MG per tablet 1 tablet  1 tablet Oral Q12H Rachael Fee, MD   1 tablet at 12/23/13 0804  . ARIPiprazole (ABILIFY) tablet 15 mg  15 mg Oral Daily Beau Fanny, FNP   15 mg at 12/22/13 1943  . benzocaine (ORAJEL) 10 % mucosal gel   Mouth/Throat QID PRN Rachael Fee, MD   1 application at 12/23/13 0805  . buprenorphine (SUBUTEX) SL tablet 4 mg  4 mg Sublingual BID Rachael Fee, MD   4 mg at 12/23/13 0803  . dextromethorphan-guaiFENesin (MUCINEX DM) 30-600 MG per 12 hr tablet 1 tablet  1 tablet Oral BID Rachael Fee, MD   1 tablet at 12/23/13 [phone]  . feeding supplement (ENSURE COMPLETE) (ENSURE COMPLETE) liquid 237 mL  237 mL Oral TID BM Tenny Craw, RD   237 mL at 12/23/13 1511  . gabapentin (  NEURONTIN) capsule 300 mg  300 mg Oral TID Rachael Fee, MD   300 mg at 12/23/13 1139  . ibuprofen (ADVIL,MOTRIN) tablet 600 mg  600 mg Oral Q6H PRN Beau Fanny, FNP   600 mg at 12/23/13 0949  . multivitamin with minerals tablet 1 tablet  1 tablet Oral Daily Tenny Craw, RD   1 tablet at 12/23/13 0803  . Oxcarbazepine (TRILEPTAL) tablet 300 mg  300 mg Oral BID Beau Fanny, FNP   300 mg at 12/23/13 1610  . oxybutynin (DITROPAN) tablet 5 mg  5 mg Oral Daily Beau Fanny, FNP   5 mg at 12/23/13 9604    Lab  Results: No results found for this or any previous visit (from the past 48 hour(s)).  Physical Findings: AIMS: Facial and Oral Movements Muscles of Facial Expression: None, normal Lips and Perioral Area: None, normal Jaw: None, normal Tongue: None, normal,Extremity Movements Upper (arms, wrists, hands, fingers): None, normal Lower (legs, knees, ankles, toes): None, normal, Trunk Movements Neck, shoulders, hips: None, normal, Overall Severity Severity of abnormal movements (highest score from questions above): None, normal Incapacitation due to abnormal movements: None, normal Patient's awareness of abnormal movements (rate only patient's report): No Awareness, Dental Status Current problems with teeth and/or dentures?: No Does patient usually wear dentures?: No  CIWA:  CIWA-Ar Total: 2 COWS:  COWS Total Score: 6  Treatment Plan Summary: Daily contact with patient to assess and evaluate symptoms and progress in treatment Medication management  Plan:  Medical Decision Making Problem Points:  Review of psycho-social stressors (1) Data Points:  Review of medication regiment & side effects (2)  I certify that inpatient services furnished can reasonably be expected to improve the patient's condition.   [name] A 12/23/2013, 3:50 PM

## 2013-12-23 NOTE — [facility] Group Notes (Signed)
[facility] LCSW Group Therapy 12/23/2013  1:15 pm  Type of Therapy: Group Therapy Participation Level: Active  Participation Quality: Attentive, Sharing and Supportive  Affect: Depressed and Flat  Cognitive: Alert and Oriented  Insight: Developing/Improving and Engaged  Engagement in Therapy: Developing/Improving and Engaged  Modes of Intervention: Clarification, Confrontation, Discussion, Education, Exploration,  Limit-setting, Orientation, Problem-solving, Rapport Building, Dance movement psychotherapist, Socialization and Support  Summary of Progress/Problems: Pt identified obstacles faced currently and processed barriers involved in overcoming these obstacles. Pt identified steps necessary for overcoming these obstacles and explored motivation (internal and external) for facing these difficulties head on. Pt further identified one area of concern in their lives and chose a goal to focus on for today. Patient identified her primary obstacle as avoiding her relapse triggers. Patient states that she can do this by avoiding people that she knows, CSW emphasized importance of staying connected with a strong support group. CSW's provided emotional support and encouragement.  [name], MSW, [employer] Clinical Social Worker [facility] [phone]

## 2013-12-23 NOTE — Progress Notes (Signed)
Patient ID: Christina Burch, female   DOB: 1980-10-20, 33 y.o.   MRN: [id number]  D: Pt. Denies SI/HI and A/V Hallucinations. Patient reports pain in her upper left tooth and has been given PRN Orajel and Ibuprofen however patient reports that the pain is not relieved. NP was notified of this. Patient rates her depression and anxiety 6/10 for the day. Patient rates her hopelessness at 9/10 for the day. Patient slept fair last night, has a good appetite, energy level is low, and ability to concentrate is poor.   A: Support and encouragement provided to the patient to come to writer with questions or concerns and speak with LCSW if patient is feeling too unwell to attend group. Patient verbalized understanding. Patient received prescribed meds per physician's orders.  R: Patient is receptive and cooperative but minimal with Clinical research associate. Patient is mostly seen in the bed due to not feeling well however patient attended afternoon group. Patient is seen speaking to other peers at times. Q15 minute checks are maintained for safety.

## 2013-12-24 DIAGNOSIS — F19939 Other psychoactive substance use, unspecified with withdrawal, unspecified: Secondary | ICD-10-CM

## 2013-12-24 DIAGNOSIS — F332 Major depressive disorder, recurrent severe without psychotic features: Secondary | ICD-10-CM

## 2013-12-24 MED ORDER — BUPRENORPHINE HCL 2 MG SL SUBL: 4.0000 mg | SUBLINGUAL_TABLET | Freq: Two times a day (BID) | SUBLINGUAL | Status: DC

## 2013-12-24 MED ORDER — OXCARBAZEPINE 300 MG PO TABS: 300.0000 mg | ORAL_TABLET | Freq: Two times a day (BID) | ORAL | Status: DC

## 2013-12-24 MED ORDER — GABAPENTIN 300 MG PO CAPS: 300.0000 mg | ORAL_CAPSULE | Freq: Three times a day (TID) | ORAL | Status: DC

## 2013-12-24 MED ORDER — BENZOCAINE 10 % MT GEL: Freq: Four times a day (QID) | OROMUCOSAL | Status: AC | PRN

## 2013-12-24 MED ORDER — ALBUTEROL SULFATE HFA 108 (90 BASE) MCG/ACT IN AERS: 2.0000 | INHALATION_SPRAY | Freq: Four times a day (QID) | RESPIRATORY_TRACT | Status: AC | PRN

## 2013-12-24 MED ORDER — DM-GUAIFENESIN ER 30-600 MG PO TB12: 1.0000 | ORAL_TABLET | Freq: Two times a day (BID) | ORAL | Status: DC

## 2013-12-24 MED ORDER — ARIPIPRAZOLE 15 MG PO TABS: 15.0000 mg | ORAL_TABLET | Freq: Every day | ORAL | Status: DC

## 2013-12-24 MED ORDER — AMOXICILLIN-POT CLAVULANATE 875-125 MG PO TABS: 1.0000 | ORAL_TABLET | Freq: Two times a day (BID) | ORAL | Status: DC

## 2013-12-24 MED ORDER — OXYBUTYNIN CHLORIDE 5 MG PO TABS: 5.0000 mg | ORAL_TABLET | Freq: Every day | ORAL | Status: DC

## 2013-12-24 NOTE — Progress Notes (Signed)
Recreation Therapy Notes  Animal-Assisted Activity/Therapy (AAA/T) Program Checklist/Progress Notes Patient Eligibility Criteria Checklist & Daily Group note for Rec Tx Intervention  Date: 09.22.2015 Time: 2:45pm Location: [address]    AAA/T Program Assumption of Risk Form signed by Patient/ or Parent Legal Guardian yes  Patient is free of allergies or sever asthma yes  Patient reports no fear of animals yes  Patient reports no history of cruelty to animals yes   Patient understands his/her participation is voluntary yes  Behavioral Response: Did not attend.   Marykay Lex Danayah Smyre, LRT/CTRS  Aristidis Talerico L 12/24/2013 3:24 PM

## 2013-12-24 NOTE — Progress Notes (Signed)
Patient ID: Christina Burch, female   DOB: [birth year], 33 y.o.   MRN: [id number] She has been up and to groups interacting with peers and staff.Self inventory: depression 7, hopelessness 6, anxiety  6, withdrawals of nausea ,given ginger ale, irritabllity, denies SI thoughts , pain toothache,prn given, and goal to get appointment before she leaves.

## 2013-12-24 NOTE — [facility] Group Notes (Signed)
[facility] LCSW Group Therapy 12/24/2013  1:15 PM   Type of Therapy: Group Therapy  Participation Level: Did Not Attend.   Brinn Westby, MSW, LCSWA Clinical Social Worker [facility] [phone]   

## 2013-12-24 NOTE — Discharge Summary (Signed)
Physician Discharge Summary Note  Patient:  Christina Burch is an 33 y.o., female MRN:  [id number] DOB:  [birth year] Patient phone:  [phone] (home)  Patient address:   [address] [place] [address],  Total Time spent with patient: Greater than 30 minutes  Date of Admission:  12/17/2013 Date of Discharge: 12/24/13  Reason for Admission: Drug detox & mood stabilization treatments  Discharge Diagnoses: Active Problems:   MDD (major depressive disorder), recurrent severe, without psychosis   Opioid dependence  Psychiatric Specialty Exam: Physical Exam  Psychiatric: Her speech is normal and behavior is normal. Judgment and thought content normal. Her mood appears not anxious. Her affect is not angry, not blunt, not labile and not inappropriate. Cognition and memory are normal. She does not exhibit a depressed mood.    Review of Systems  Constitutional: Negative.   HENT: Negative.   Eyes: Negative.   Respiratory: Negative.   Cardiovascular: Negative.   Gastrointestinal: Negative.   Genitourinary: Negative.   Musculoskeletal: Negative.   Skin: Negative.   Neurological: Negative.   Endo/Heme/Allergies: Negative.   Psychiatric/Behavioral: Positive for depression (Stable) and substance abuse (Opioid dependence). Negative for suicidal ideas, hallucinations and memory loss. The patient has insomnia (Stable). The patient is not nervous/anxious.     Blood pressure 110/64, pulse 83, temperature 98.8 F (37.1 C), temperature source Oral, resp. rate 18, height 5' 2.75" (1.594 m), weight 80.287 kg (177 lb), last menstrual period 11/06/2013, SpO2 98.00%.Body mass index is 31.6 kg/(m^2).   Past Psychiatric History: Diagnosis: Opioid dependence,  MDD (major depressive disorder), recurrent severe, without psychosis  Hospitalizations: [facility] adult unit  Outpatient Care: [facility] in [place], [place]  Substance Abuse Care: [facility] in [place], [place]   Self-Mutilation: NA  Suicidal Attempts: NA  Violent Behaviors:NA   Musculoskeletal: Strength & Muscle Tone: within normal limits Gait & Station: normal Patient leans: N/A  DSM5: Schizophrenia Disorders: NA Obsessive-Compulsive Disorders:  NA Trauma-Stressor Disorders:  NA Substance/Addictive Disorders:  Opioid Disorder - Severe (304.00) Depressive Disorders:  MDD (major depressive disorder), recurrent severe, without psychosis  Axis Diagnosis:  AXIS I:  Alcohol dependence, MDD (major depressive disorder), recurrent severe, without psychosis AXIS II:  Deferred AXIS III:   Past Medical History  Diagnosis Date  . Borderline personality disorder   . Suicidal thoughts   . Addiction to drug    AXIS IV:  other psychosocial or environmental problems and substance dependence AXIS V:  63  Level of Care:  OP  Hospital Course:  HPI Christina Burch is a 33 y.o. female with a history of borderline personality disorder, previous suicide attempts who comes in for evaluation of suicidal thoughts. She says she has not taken her Abilify or Trileptal for several weeks because she ran out. and has led to her having suicidal thoughts. She does not have a plan in place, but says she may do it the way she has tried in the past which is overdosing on pain medicine. She reports also having tried slitting her wrists in the past.          Christina Burch was admitted to the adult 300 unit where she was evaluated and her symptoms were identified. Medication management was discussed and implemented. The patient was restarted on her psychotropic medications of Abilify, Trileptal, and Neurontin. They were started at lower doses since the patient had reported not taking them for several weeks due to running out.  Patient was placed on a Subutex  detox due to her history of opiate use. Patient reported on admission relapsing on opiates after being off Suboxone due to being dropped from the clinic after not being able to  keep her appointments. She was encouraged to participate in unit programming. Medical problems were identified and treated appropriately. The patient was noted to be pregnant on admission. The patient was adamant about having an abortion after discharge. She identified having stressors of already having seven children to care for. Home medication was restarted as needed. The patient was treated with a course of Augmentin for complaints of tooth pain. She was evaluated each day by a clinical provider to ascertain the patient's response to treatment.  Improvement was noted by the patient's report of decreasing symptoms, improved sleep and appetite, affect, medication tolerance, behavior, and participation in unit programming.  The patient was asked each day to complete a self inventory noting mood, mental status, pain, new symptoms, anxiety and concerns.         She responded well to medication and being in a therapeutic and supportive environment. Positive and appropriate behavior was noted and the patient was motivated for recovery.  She worked closely with the treatment team and case manager to develop a discharge plan with appropriate goals. Coping skills, problem solving as well as relaxation therapies were also part of the unit programming.         By the day of discharge she was in much improved condition than upon admission.  Symptoms were reported as significantly decreased or resolved completely. The patient denied SI/HI and voiced no AVH. She was motivated to continue taking medication with a goal of continued improvement in mental health. Christina Burch was discharged home with a plan to follow up as noted below. Patient left [facility] in no acute distress with prescriptions and all belongings being returned to her.   Consults:  psychiatry  Significant Diagnostic Studies:  labs: CBC with diff, CMP, UDS, toxicology tests, U/A, reults reviewed, stable  Discharge Vitals:   Blood pressure 110/64, pulse 83,  temperature 98.8 F (37.1 C), temperature source Oral, resp. rate 18, height 5' 2.75" (1.594 m), weight 80.287 kg (177 lb), last menstrual period 11/06/2013, SpO2 98.00%. Body mass index is 31.6 kg/(m^2). Lab Results:   No results found for this or any previous visit (from the past 72 hour(s)).  Physical Findings: AIMS: Facial and Oral Movements Muscles of Facial Expression: None, normal Lips and Perioral Area: None, normal Jaw: None, normal Tongue: None, normal,Extremity Movements Upper (arms, wrists, hands, fingers): None, normal Lower (legs, knees, ankles, toes): None, normal, Trunk Movements Neck, shoulders, hips: None, normal, Overall Severity Severity of abnormal movements (highest score from questions above): None, normal Incapacitation due to abnormal movements: None, normal Patient's awareness of abnormal movements (rate only patient's report): No Awareness, Dental Status Current problems with teeth and/or dentures?: No Does patient usually wear dentures?: No  CIWA:  CIWA-Ar Total: 2 COWS:  COWS Total Score: 6  Psychiatric Specialty Exam: See Psychiatric Specialty Exam and Suicide Risk Assessment completed by Attending Physician prior to discharge.  Discharge destination:  Home  Is patient on multiple antipsychotic therapies at discharge:  No   Has Patient had three or more failed trials of antipsychotic monotherapy by history:  No  Recommended Plan for Multiple Antipsychotic Therapies: NA    Medication List    STOP taking these medications       amphetamine-dextroamphetamine 30 MG 24 hr capsule  Commonly known as:  ADDERALL XR  buprenorphine-naloxone 8-2 MG Subl SL tablet  Commonly known as:  SUBOXONE     gabapentin 600 MG tablet  Commonly known as:  NEURONTIN  Replaced by:  gabapentin 300 MG capsule     Iron Tabs     TYLENOL COLD 30-2-15-325 MG Tabs  Generic drug:  [id number]      TAKE these medications     Indication   albuterol 108  (90 BASE) MCG/ACT inhaler  Commonly known as:  PROVENTIL HFA;VENTOLIN HFA  Inhale 2 puffs into the lungs every 6 (six) hours as needed for wheezing or shortness of breath (chest tightness).   Indication:  Asthma     amoxicillin-clavulanate 875-125 MG per tablet  Commonly known as:  AUGMENTIN  Take 1 tablet by mouth every 12 (twelve) hours. For upper respiratory infection   Indication:  URI     ARIPiprazole 15 MG tablet  Commonly known as:  ABILIFY  Take 1 tablet (15 mg total) by mouth daily. For mood control   Indication:  Mood control     benzocaine 10 % mucosal gel  Commonly known as:  ORAJEL  Use as directed in the mouth or throat 4 (four) times daily as needed for mouth pain.   Indication:  Mouth pain     buprenorphine 2 MG Subl SL tablet  Commonly known as:  SUBUTEX  Place 2 tablets (4 mg total) under the tongue 2 (two) times daily. (See hand written prescription from the MD): For opioid dependence   Indication:  Opioid Dependence     dextromethorphan-guaiFENesin 30-600 MG per 12 hr tablet  Commonly known as:  MUCINEX DM  Take 1 tablet by mouth 2 (two) times daily. For cough   Indication:  Cough     gabapentin 300 MG capsule  Commonly known as:  NEURONTIN  Take 1 capsule (300 mg total) by mouth 3 (three) times daily. For agitation/substance withdrawal syndrome   Indication:  Agitation, Substance withdrawal syndrome     Oxcarbazepine 300 MG tablet  Commonly known as:  TRILEPTAL  Take 1 tablet (300 mg total) by mouth 2 (two) times daily. For mood stabilization   Indication:  Mood stabilization     oxybutynin 5 MG tablet  Commonly known as:  DITROPAN  Take 1 tablet (5 mg total) by mouth daily. For bladder spasm   Indication:  Frequent Urination, Urinary Incontinence       Follow-up Information   Follow up with [facility] On 12/26/2013. (Please present to [facility] on this date at 1pm for intake appointment. Please bring your photo I.D. and medicaid  card. Please call office if you need to reschedule appointment.)    Contact information:   [address], [place], [place] [address] [phone]        Follow up with [facility] On 12/25/2013. (Please present on this date at 8:30 am for intake assessment for therapy and medication management services with [facility]. Please bring your photo I.D., social security card, and insurance card. Please call office if you need to reschedule.)    Contact information:   Phone: [phone]  Fax: [phone]  Address: [address] [place], [place], [place] [address]     Follow-up recommendations:  Activity:  As tolerated Diet: As recommended by your primary care doctor. Keep all scheduled follow-up appointments as recommended.  Comments:  Take all your medications as prescribed by your mental healthcare provider. Report any adverse effects and or reactions from your medicines to your  outpatient provider promptly. Patient is instructed and cautioned to not engage in alcohol and or illegal drug use while on prescription medicines. In the event of worsening symptoms, patient is instructed to call the crisis hotline, 911 and or go to the nearest ED for appropriate evaluation and treatment of symptoms. Follow-up with your primary care provider for your other medical issues, concerns and or health care needs.   Total Discharge Time:  Greater than 30 minutes.  Signed: Fransisca Kaufmann , NP-C 12/24/2013, 11:27 AM I personally assessed the patient and formulated the plan Madie Reno A. Dub Mikes, M.D.

## 2013-12-24 NOTE — Progress Notes (Signed)
Writer reviewed pt discharge instructions with pt including medications, follow up care and crisis intervention. Pt acknowledged understanding of instructions and states that she has no reservations about leaving [facility] at this time. Pt denies SI/HI and AVH. Pt mood and affect are appropriate to the situation. Writer returned pt belongings from locker, and the pt is released into she own care.

## 2013-12-24 NOTE — [facility] Suicide Risk Assessment (Signed)
Suicide Risk Assessment  Discharge Assessment     Demographic Factors:  Caucasian  Total Time spent with patient: 30 minutes  Psychiatric Specialty Exam:     Blood pressure 114/62, pulse 75, temperature 98.8 F (37.1 C), temperature source Oral, resp. rate 18, height 5' 2.75" (1.594 m), weight 80.287 kg (177 lb), last menstrual period 11/06/2013, SpO2 98.00%.Body mass index is 31.6 kg/(m^2).  General Appearance: Fairly Groomed  Patent attorney::  Fair  Speech:  Clear and Coherent  Volume:  Normal  Mood:  Anxious and worried  Affect:  Appropriate  Thought Process:  Coherent relevant  Orientation:  Full (Time, Place, and Person)  Thought Content:  events, worries concerns plans as she moves on, relapse prevention plan  Suicidal Thoughts:  No  Homicidal Thoughts:  No  Memory:  Immediate;   Fair Recent;   Fair Remote;   Fair  Judgement:  Fair  Insight:  Present  Psychomotor Activity:  Normal  Concentration:  Fair  Recall:  [employer] of Knowledge:NA  Language: Fair  Akathisia:  No  Handed:    AIMS (if indicated):     Assets:  Desire for Improvement  Sleep:  Number of Hours: 6.75    Musculoskeletal: Strength & Muscle Tone: within normal limits Gait & Station: normal Patient leans: N/A   Mental Status Per Nursing Assessment::   On Admission:  Suicidal ideation indicated by patient  Current Mental Status by Physician: IN full contact with reality. There are no active S/S of withdrawal. She is going to continue to use the Suboxone as was able to secure follow up with a clinic. She is going to go back to  her own place and her sister will be staying with her. She still plans to terminate the pregnancy beginning of the month   Loss Factors: termination pregnancy  Historical Factors: NA  Risk Reduction Factors:   Living with another person, especially a relative  Continued Clinical Symptoms:  Depression:   Comorbid alcohol abuse/dependence Alcohol/Substance  Abuse/Dependencies  Cognitive Features That Contribute To Risk:  Polarized thinking Thought constriction (tunnel vision)    Suicide Risk:  Minimal: No identifiable suicidal ideation.  Patients presenting with no risk factors but with morbid ruminations; may be classified as minimal risk based on the severity of the depressive symptoms  Discharge Diagnoses:   AXIS I:  Major Depression recurrent severe, Opioid Dependence AXIS II:  No diagnosis AXIS III:   Past Medical History  Diagnosis Date  . Borderline personality disorder   . Suicidal thoughts   . Addiction to drug    AXIS IV:  other psychosocial or environmental problems AXIS V:  61-70 mild symptoms  Plan Of Care/Follow-up recommendations:  Activity:  as tolerated Diet:  regular Follow up [facility] Is patient on multiple antipsychotic therapies at discharge:  No   Has Patient had three or more failed trials of antipsychotic monotherapy by history:  No  Recommended Plan for Multiple Antipsychotic Therapies: NA    Shaakira Borrero A 12/24/2013, 12:00 PM

## 2013-12-24 NOTE — Tx Team (Signed)
Interdisciplinary Treatment Plan Update (Adult) Date: 12/24/2013   Time Reviewed: 9:30 AM  Progress in Treatment: Attending groups: Minimally Participating in groups: Yes Taking medication as prescribed: Yes Tolerating medication: Yes Family/Significant other contact made: No, CSW has attempted to speak with patient's mother but not able to get in contact with her. Patient has spoken to her mother and sister since hospitalization Patient understands diagnosis: Yes Discussing patient identified problems/goals with staff: Yes Medical problems stabilized or resolved: Yes Denies suicidal/homicidal ideation: Yes Issues/concerns per patient self-inventory: Yes Other:  New problem(s) identified: N/A  Discharge Plan or Barriers: Discharge anticipated for today 12/24/13 if patient can complete telephone assessment for follow up appointment for suboxone/subutex treatment at [facility]. Otherwise, patient will need to be tapered off of subutex.  Reason for Continuation of Hospitalization:  Depression Anxiety Medication Stabilization   Comments: N/A  Estimated length of stay: Discharge anticipated for today 12/24/13 if she can complete telephone assessment for follow up appointment for suboxone/subutex services.  For review of initial/current patient goals, please see plan of care. Patient and CSW reviewed pt's identified goals and treatment plan. Pt verbalized understanding and agreed to treatment plan.   Attendees:  Patient:    Family:    Physician: Dr. Dub Mikes  12/24/2013 9:30 AM   Nursing: Roswell Miners; Kathi Simpers; Michaelle Birks, RN  12/24/2013 9:30 AM   Clinical Social Worker: [name], LCSWA  12/24/2013 9:30 AM   Other: Juline Patch, LCSW  12/24/2013 9:30 AM   Other: Leisa Lenz, Vesta Mixer Liaison  12/24/2013 9:30 AM   Other: Onnie Boer, Case Manager  12/24/2013 9:30 AM   Other:  12/24/2013 9:30 AM   Other:  12/24/2013 9:30 AM   Other:    Other:    Other:       Scribe for Treatment Team:  [name], MSW, [employer] [phone]

## 2013-12-24 NOTE — Progress Notes (Signed)
[facility] Adult Case Management Discharge Plan :  Will you be returning to the same living situation after discharge: Yes,  patient will return home to her residence At discharge, do you have transportation home?:Yes,  patient reports that she will have transportation home via family Do you have the ability to pay for your medications:Yes,  patient will be provided with medication samples and prescriptions at discharge.   Release of information consent forms completed and in the chart;  Patient's signature needed at discharge.  Patient to Follow up at: Follow-up Information   Follow up with [facility] On 12/26/2013. (Please present to [facility] on this date at 1pm for intake appointment. Please bring your photo I.D. and medicaid card. Please call office if you need to reschedule appointment.)    Contact information:   [address] [place], [place] [address] [phone]        Follow up with Emelia Loron On 12/25/2013. (Please present on this date at 8:30 am for intake assessment for therapy and medication management services with [facility]. Please bring your photo I.D., social security card, and insurance card. Please call office if you need to reschedule.)    Contact information:   Phone: [phone]  Fax: [phone]  Address: [address] [place], [place], [place] [address]      Patient denies SI/HI:   Yes,  denies    Safety Planning and Suicide Prevention discussed:  Yes,  with patient  England Greb, [place] 12/24/2013, 11:40 AM

## 2013-12-25 NOTE — Progress Notes (Signed)
Patient ID: Christina Burch, female   DOB: 1980/08/31, 33 y.o.   MRN: [id number] PER STATE REGULATIONS 482.30  THIS CHART WAS REVIEWED FOR MEDICAL NECESSITY WITH RESPECT TO THE PATIENT'S ADMISSION/ DURATION OF STAY.  NEXT REVIEW DATE: 12/25/2013  Willa Rough, RN, BSN CASE MANAGER

## 2013-12-26 NOTE — Progress Notes (Signed)
Patient Discharge Instructions:  After Visit Summary (AVS):   Faxed to:  12/26/13 Discharge Summary Note:   Faxed to:  12/26/13 Psychiatric Admission Assessment Note:   Faxed to:  12/26/13 Suicide Risk Assessment - Discharge Assessment:   Faxed to:  12/26/13 Faxed/Sent to the [facility] provider:  12/26/13 Faxed to [facility] @ [phone] Faxed to [facility] @ [phone]  Jerelene Redden, 12/26/2013, 3:21 PM

## 2014-01-03 DIAGNOSIS — M545 Low back pain: Secondary | ICD-10-CM | POA: Diagnosis not present

## 2014-01-03 DIAGNOSIS — Z79891 Long term (current) use of opiate analgesic: Secondary | ICD-10-CM | POA: Diagnosis not present

## 2014-01-08 DIAGNOSIS — S61211A Laceration without foreign body of left index finger without damage to nail, initial encounter: Secondary | ICD-10-CM | POA: Diagnosis not present

## 2014-01-13 DIAGNOSIS — R5383 Other fatigue: Secondary | ICD-10-CM | POA: Diagnosis not present

## 2014-02-08 DIAGNOSIS — L03112 Cellulitis of left axilla: Secondary | ICD-10-CM | POA: Diagnosis not present

## 2014-02-08 DIAGNOSIS — O9989 Other specified diseases and conditions complicating pregnancy, childbirth and the puerperium: Secondary | ICD-10-CM | POA: Diagnosis not present

## 2014-02-08 DIAGNOSIS — F17210 Nicotine dependence, cigarettes, uncomplicated: Secondary | ICD-10-CM | POA: Diagnosis not present

## 2014-02-08 DIAGNOSIS — L03114 Cellulitis of left upper limb: Secondary | ICD-10-CM | POA: Diagnosis not present

## 2014-04-15 DIAGNOSIS — F319 Bipolar disorder, unspecified: Secondary | ICD-10-CM | POA: Diagnosis not present

## 2014-05-31 ENCOUNTER — Encounter (HOSPITAL_COMMUNITY): Payer: Self-pay | Admitting: Emergency Medicine

## 2014-05-31 ENCOUNTER — Emergency Department (HOSPITAL_COMMUNITY): Payer: Medicare Other

## 2014-05-31 ENCOUNTER — Emergency Department (HOSPITAL_COMMUNITY)
Admission: EM | Admit: 2014-05-31 | Discharge: 2014-06-04 | Disposition: A | Discharge status: Other Acute Inpatient Hospital | Payer: Medicare Other | Attending: Emergency Medicine | Admitting: Emergency Medicine

## 2014-05-31 DIAGNOSIS — F1120 Opioid dependence, uncomplicated: Secondary | ICD-10-CM | POA: Diagnosis present

## 2014-05-31 DIAGNOSIS — R1032 Left lower quadrant pain: Secondary | ICD-10-CM | POA: Diagnosis not present

## 2014-05-31 DIAGNOSIS — F1110 Opioid abuse, uncomplicated: Secondary | ICD-10-CM | POA: Diagnosis not present

## 2014-05-31 DIAGNOSIS — R45851 Suicidal ideations: Secondary | ICD-10-CM | POA: Diagnosis not present

## 2014-05-31 DIAGNOSIS — F1920 Other psychoactive substance dependence, uncomplicated: Secondary | ICD-10-CM

## 2014-05-31 DIAGNOSIS — F1914 Other psychoactive substance abuse with psychoactive substance-induced mood disorder: Secondary | ICD-10-CM | POA: Diagnosis not present

## 2014-05-31 DIAGNOSIS — F1910 Other psychoactive substance abuse, uncomplicated: Secondary | ICD-10-CM

## 2014-05-31 DIAGNOSIS — O99320 Drug use complicating pregnancy, unspecified trimester: Secondary | ICD-10-CM | POA: Diagnosis not present

## 2014-05-31 DIAGNOSIS — Z3A00 Weeks of gestation of pregnancy not specified: Secondary | ICD-10-CM | POA: Diagnosis not present

## 2014-05-31 DIAGNOSIS — Z3A27 27 weeks gestation of pregnancy: Secondary | ICD-10-CM | POA: Diagnosis not present

## 2014-05-31 DIAGNOSIS — F332 Major depressive disorder, recurrent severe without psychotic features: Secondary | ICD-10-CM

## 2014-05-31 DIAGNOSIS — Z3490 Encounter for supervision of normal pregnancy, unspecified, unspecified trimester: Secondary | ICD-10-CM

## 2014-05-31 DIAGNOSIS — Z331 Pregnant state, incidental: Secondary | ICD-10-CM | POA: Diagnosis not present

## 2014-05-31 DIAGNOSIS — O26893 Other specified pregnancy related conditions, third trimester: Secondary | ICD-10-CM | POA: Diagnosis not present

## 2014-05-31 HISTORY — DX: Encounter for supervision of normal pregnancy, unspecified, unspecified trimester: Z34.90

## 2014-05-31 LAB — URINALYSIS, ROUTINE W REFLEX MICROSCOPIC
Glucose, UA: NEGATIVE mg/dL
Hgb urine dipstick: NEGATIVE
Ketones, ur: NEGATIVE mg/dL
LEUKOCYTES UA: NEGATIVE
NITRITE: NEGATIVE
PROTEIN: NEGATIVE mg/dL
Specific Gravity, Urine: 1.026 (ref 1.005–1.030)
UROBILINOGEN UA: 1 mg/dL (ref 0.0–1.0)
pH: 6 (ref 5.0–8.0)

## 2014-05-31 LAB — COMPREHENSIVE METABOLIC PANEL
ALK PHOS: 97 U/L (ref 39–117)
ALT: 16 U/L (ref 0–35)
ANION GAP: 7 (ref 5–15)
AST: 18 U/L (ref 0–37)
Albumin: 2.6 g/dL — ABNORMAL LOW (ref 3.5–5.2)
BILIRUBIN TOTAL: 0.5 mg/dL (ref 0.3–1.2)
CHLORIDE: 107 mmol/L (ref 96–112)
CO2: 23 mmol/L (ref 19–32)
CREATININE: 0.63 mg/dL (ref 0.50–1.10)
Calcium: 8.5 mg/dL (ref 8.4–10.5)
GLUCOSE: 116 mg/dL — AB (ref 70–99)
POTASSIUM: 3.6 mmol/L (ref 3.5–5.1)
SODIUM: 137 mmol/L (ref 135–145)
TOTAL PROTEIN: 5.9 g/dL — AB (ref 6.0–8.3)

## 2014-05-31 LAB — RAPID URINE DRUG SCREEN, HOSP PERFORMED
AMPHETAMINES: NOT DETECTED
BARBITURATES: NOT DETECTED
Benzodiazepines: POSITIVE — AB
Cocaine: POSITIVE — AB
Opiates: POSITIVE — AB
Tetrahydrocannabinol: NOT DETECTED

## 2014-05-31 LAB — CBC
HCT: 29.5 % — ABNORMAL LOW (ref 36.0–46.0)
Hemoglobin: 9.7 g/dL — ABNORMAL LOW (ref 12.0–15.0)
MCH: 27.2 pg (ref 26.0–34.0)
MCHC: 32.9 g/dL (ref 30.0–36.0)
MCV: 82.6 fL (ref 78.0–100.0)
Platelets: 195 10*3/uL (ref 150–400)
RBC: 3.57 MIL/uL — ABNORMAL LOW (ref 3.87–5.11)
RDW: 13.6 % (ref 11.5–15.5)
WBC: 6.3 10*3/uL (ref 4.0–10.5)

## 2014-05-31 LAB — SALICYLATE LEVEL: Salicylate Lvl: <4 mg/dL (ref 2.8–20.0)

## 2014-05-31 LAB — HEPATITIS B SURFACE ANTIGEN: HEP B S AG: NEGATIVE

## 2014-05-31 LAB — ACETAMINOPHEN LEVEL: Acetaminophen (Tylenol), Serum: <10 ug/mL — ABNORMAL LOW (ref 10–30)

## 2014-05-31 LAB — ETHANOL: Alcohol, Ethyl (B): <5 mg/dL (ref 0–9)

## 2014-05-31 NOTE — ED Provider Notes (Addendum)
CSN: [id number]     Arrival date & time 05/31/14  1422 History   First MD Initiated Contact with Patient 05/31/14 1558     Chief Complaint  Patient presents with  . Addiction Problem  . Suicidal     (Consider location/radiation/quality/duration/timing/severity/associated sxs/prior Treatment) The history is provided by the patient.   patient presents with polysubstance abuse and some suicidal thoughts. She also states she is around months pregnant. Last menses was in August. States she'll will use opiates and benzos cocaine and some stimulants. She will use pastry as much as she can afford. States her mood has been getting worse. She is having suicidal thoughts but stated she would not do it. History of same. States she has had no prenatal care. No Vaginal bleeding. Some dysuria.  Past Medical History  Diagnosis Date  . Borderline personality disorder   . Suicidal thoughts   . Addiction to drug    History reviewed. No pertinent past surgical history. No family history on file. History  Substance Use Topics  . Smoking status: Current Every Day Smoker -- 0.50 packs/day    Types: Cigarettes  . Smokeless tobacco: Not on file  . Alcohol Use: No   OB History    Gravida Para Term Preterm AB TAB SAB Ectopic Multiple Living   1              Review of Systems  Constitutional: Negative for activity change and appetite change.  Eyes: Negative for pain.  Respiratory: Negative for chest tightness and shortness of breath.   Cardiovascular: Negative for chest pain and leg swelling.  Gastrointestinal: Negative for nausea, vomiting, abdominal pain and diarrhea.  Genitourinary: Positive for dysuria. Negative for flank pain and vaginal bleeding.  Musculoskeletal: Negative for back pain and neck stiffness.  Skin: Negative for rash.  Neurological: Negative for weakness, numbness and headaches.  Psychiatric/Behavioral: Positive for suicidal ideas and dysphoric mood. Negative for behavioral  problems.      Allergies  Abilify  Home Medications   Prior to Admission medications   Medication Sig Start Date End Date Taking? Authorizing Provider  acetaminophen (TYLENOL) 500 MG tablet Take 1,500 mg by mouth every 6 (six) hours as needed (pain).   Yes Historical Provider, MD  benzocaine (ORAJEL) 10 % mucosal gel Use as directed in the mouth or throat 4 (four) times daily as needed for mouth pain. 12/24/13  Yes Sanjuana Kava, NP  buprenorphine (SUBUTEX) 2 MG SUBL SL tablet Place 2 tablets (4 mg total) under the tongue 2 (two) times daily. (See hand written prescription from the MD): For opioid dependence 12/24/13  Yes Sanjuana Kava, NP  gabapentin (NEURONTIN) 300 MG capsule Take 1 capsule (300 mg total) by mouth 3 (three) times daily. For agitation/substance withdrawal syndrome 12/24/13  Yes Sanjuana Kava, NP  ibuprofen (ADVIL,MOTRIN) 200 MG tablet Take 1,000 mg by mouth every 6 (six) hours as needed (pain).   Yes Historical Provider, MD  albuterol (PROVENTIL HFA;VENTOLIN HFA) 108 (90 BASE) MCG/ACT inhaler Inhale 2 puffs into the lungs every 6 (six) hours as needed for wheezing or shortness of breath (chest tightness). 12/24/13   Sanjuana Kava, NP  amoxicillin-clavulanate (AUGMENTIN) 875-125 MG per tablet Take 1 tablet by mouth every 12 (twelve) hours. For upper respiratory infection Patient not taking: Reported on 05/31/2014 12/24/13   Sanjuana Kava, NP  ARIPiprazole (ABILIFY) 15 MG tablet Take 1 tablet (15 mg total) by mouth daily. For mood control Patient not taking: Reported  on 05/31/2014 12/24/13   Sanjuana KavaAgnes I Nwoko, NP  dextromethorphan-guaiFENesin [facility](MUCINEX DM) 30-600 MG per 12 hr tablet Take 1 tablet by mouth 2 (two) times daily. For cough Patient not taking: Reported on 05/31/2014 12/24/13   Sanjuana KavaAgnes I Nwoko, NP  Oxcarbazepine (TRILEPTAL) 300 MG tablet Take 1 tablet (300 mg total) by mouth 2 (two) times daily. For mood stabilization Patient not taking: Reported on 05/31/2014 12/24/13   Sanjuana KavaAgnes I  Nwoko, NP  oxybutynin (DITROPAN) 5 MG tablet Take 1 tablet (5 mg total) by mouth daily. For bladder spasm Patient not taking: Reported on 05/31/2014 12/24/13   Sanjuana KavaAgnes I Nwoko, NP   BP 106/60 mmHg  Pulse 78  Temp(Src) 98 F (36.7 C) (Oral)  Resp 18  Ht 5' 4.5" (1.638 m)  Wt 180 lb (81.647 kg)  BMI 30.43 kg/m2  SpO2 98%  LMP 11/06/2013 Physical Exam  Constitutional: She appears well-developed and well-nourished.  HENT:  Head: Normocephalic and atraumatic.  Eyes: EOM are normal. Pupils are equal, round, and reactive to light.  Neck: Normal range of motion. Neck supple.  Cardiovascular: Normal rate, regular rhythm and normal heart sounds.   No murmur heard. Pulmonary/Chest: Effort normal and breath sounds normal. No respiratory distress. She has no wheezes. She has no rales.  Abdominal: Soft. Bowel sounds are normal. She exhibits mass. She exhibits no distension. There is no tenderness. There is no rebound and no guarding.  Apparent gravid uterus to well above the umbilicus. Approximately 8 cm above umbilicus.  Musculoskeletal: Normal range of motion.  Neurological: She is alert. No cranial nerve deficit.  Skin: Skin is warm.  Psychiatric: She has a normal mood and affect. Her speech is normal.  Nursing note and vitals reviewed.   ED Course  Procedures (including critical care time) Labs Review Labs Reviewed  ACETAMINOPHEN LEVEL - Abnormal; Notable for the following:    Acetaminophen (Tylenol), Serum <10.0 (*)    All other components within normal limits  CBC - Abnormal; Notable for the following:    RBC 3.57 (*)    Hemoglobin 9.7 (*)    HCT 29.5 (*)    All other components within normal limits  COMPREHENSIVE METABOLIC PANEL - Abnormal; Notable for the following:    Glucose, Bld 116 (*)    BUN <5 (*)    Total Protein 5.9 (*)    Albumin 2.6 (*)    All other components within normal limits  URINE RAPID DRUG SCREEN (HOSP PERFORMED) - Abnormal; Notable for the following:     Opiates POSITIVE (*)    Cocaine POSITIVE (*)    Benzodiazepines POSITIVE (*)    All other components within normal limits  URINALYSIS, ROUTINE W REFLEX MICROSCOPIC - Abnormal; Notable for the following:    Color, Urine AMBER (*)    APPearance CLOUDY (*)    Bilirubin Urine SMALL (*)    All other components within normal limits  ETHANOL  SALICYLATE LEVEL  HEPATITIS B SURFACE ANTIGEN  RUBELLA SCREEN  RPR  HIV ANTIBODY (ROUTINE TESTING)    Imaging Review [place] [facility] Limited  05/31/2014   CLINICAL DATA:  Left lower quadrant pain and back pain. Estimated gestational age by LMP is 27 weeks 0 days. No prenatal care.  EXAM: LIMITED OBSTETRIC ULTRASOUND  FINDINGS: Number of Fetuses: 1  Heart Rate:  144 bpm  Movement: Yes  Presentation: Cephalic  Placental Location: Anterior  Previa: No  Amniotic Fluid (Subjective):  Within normal limits.  BPD:  7.4cm 29w  4d  MATERNAL FINDINGS:  Cervix:  Cervix is not visualized.  Uterus/Adnexae: Limited visualization. No specific abnormality identified. Ovaries are not identified.  IMPRESSION: Single intrauterine pregnancy. Fetus is in cephalic presentation. Anterior placenta without previa. No acute complication is suggested on limited examination.  This exam is performed on an emergent basis and does not comprehensively evaluate fetal size, dating, or anatomy; follow-up complete OB US should be considered if further fetal assessment is warranted.   Electronically Signed   By: Burman Nieves M.D.   On: 05/31/2014 22:29     EKG Interpretation None      MDM   Final diagnoses:  Pregnant  Polysubstance abuse  Suicidal ideations    Patient with polysubstance abuse and suicidal thoughts. Not actively suicidal. She also is pregnant. Ultrasound done discussed with the OB on-call. Does have an intrauterine pregnancy at approximately 29 weeks. To be Seen by TTS. Medically cleared.     Juliet Rude. Rubin Payor, MD 06/01/14 0032  Discussed with TTS. States she meets  inpatient criteria but because she is pregnant there was not able to even attempt to start to place her tomorrow.  Juliet Rude. Rubin Payor, MD 06/01/14 [phone]

## 2014-05-31 NOTE — ED Notes (Signed)
Called for a sitter, will receive at 1500. Security here to wand patient, changed into house scrubs.  Personal belongings in bag and labelled.

## 2014-05-31 NOTE — ED Notes (Signed)
Pt. Stated, later in triage that Iam suicidal and have night mares and stuff

## 2014-05-31 NOTE — ED Notes (Signed)
Pt. Stated, Im trying to get into [facility] in [place], [place].  I have a drug addiction to Cocaine, Morphine, Dilaudid, any uppers I can.  Just anything.  Im also 5 months pregnant.

## 2014-06-01 ENCOUNTER — Encounter (HOSPITAL_COMMUNITY): Payer: Self-pay | Admitting: *Deleted

## 2014-06-01 DIAGNOSIS — F1110 Opioid abuse, uncomplicated: Secondary | ICD-10-CM | POA: Diagnosis not present

## 2014-06-01 LAB — RUBELLA SCREEN: RUBELLA: 2.09 {index} (ref >0.99)

## 2014-06-01 LAB — RPR: RPR Ser Ql: NONREACTIVE

## 2014-06-01 LAB — HIV ANTIBODY (ROUTINE TESTING W REFLEX): HIV SCREEN 4TH GENERATION: NONREACTIVE

## 2014-06-01 MED ORDER — BENZOCAINE 10 % MT GEL
Freq: Four times a day (QID) | OROMUCOSAL | Status: AC | PRN
  Administered 2014-06-01 (×2): via OROMUCOSAL
  Administered 2014-06-01: 1 via OROMUCOSAL
  Filled 2014-06-01: qty 9.4

## 2014-06-01 MED ORDER — CLONIDINE HCL 0.1 MG PO TABS: 0.1000 mg | ORAL_TABLET | Freq: Once | ORAL | Status: DC

## 2014-06-01 NOTE — ED Notes (Signed)
[facility] Expected date:  Expected time:  Means of arrival:  Comments: [facility]

## 2014-06-01 NOTE — BH Assessment (Addendum)
0006:  Spoke with Dr. Rubin PayorPickering about the Patient.  Dr. Rubin PayorPickering reports the Patient is pregnant and reports SI with no plan or intent.  Patient also has been using drugs.  0009:  Scheduled tele-assessment with Nurse Maxine GlennMonica.  0025:  Tele-assessment completed  0040:  Consulted with Maryjean Mornharles Kober, PA/Extender:  Per Rito EhrlichExtender Kober:  Patient meets inpatient criteria, seek placement for Patient on Monday due to special placement circumstances of pregnancy, no prenatal care, and probation.  [id number]:  Patient's disposition provided to Dr. Rubin PayorPickering.

## 2014-06-01 NOTE — Progress Notes (Signed)
CSW made referrral to [facility] for perinatal program   Obtained Auth # [id number]  Effective from 05/12/14 to 06/07/14  Obtained from [facility] at [facility].  Faxed referral to [phone]  Adelene AmasEdith Halea Lieb, LCSW Disposition Social Worker [phone]

## 2014-06-01 NOTE — ED Notes (Signed)
Dr Clarene DukeMcManus and Hermelinda Medicus Lawyer, PA, aware pt asking for Orajel d/t tooth pain. Order received. Readback for verification.

## 2014-06-01 NOTE — ED Notes (Signed)
Patient reports SI. Rates feelings of anxiety 10/10 and depression 7/10. States that her sleep has increased and she has lack of energy/motivation. Reports a fluctuating appetite.   Encouragement offered. Given beverage. Environment adjusted.  Q 15 safety checks in place.

## 2014-06-01 NOTE — BH Assessment (Signed)
Assessment Note  Christina Burch is an 34 y.o. female who reports being brought to [facility] by a Friend.  She reports wanting inpatient substance use treatment at [facility] in [place], [place].  Patient was orientated x4, mood "depressed and anxious", depressed and anxious affect, SI with some intent and no plan, denied HI and AVH.  Patient reports currently experiencing withdrawal symptoms from opiates.  Patient reports being 5 months pregnant with no prenatal care.  She reports snorting a line of cocaine and taking 1 Dilaudid 8 mg yesterday.  Patient reports taking a Valium 10 mg 2 days ago and splitting Heroin IV 40cc with her Sister a "few" days ago.  Patient also reports using Morphine recently.  Patient reports having a mental health diagnosis of Bipolar and was receiving med management from [facility] in [facility] until January 2016.  She reports stopping prescribed Abilify because of negative side effects a month ago.  Patient reports sleep duration fluctuating between 10 to to 4 hours per night.  She reports not having an appetite when using drugs and reports weight loss and gain.  Patient reports being on probation in [facility] for a revoked driver's license and that she is allowed to travel and reside outside of the county.  Patient reports a previous hospitalization at [facility] in 2015 for Bipolar for several days.  Patient reports receiving disability and residing with her Step Mother "until we were told to leave by the landlord."  Patient reports wanting inpatient substance use treatment.      Axis I: Bipolar, mixed and Opioid Use Disorder, Stimulatant Use Disorder, Anxiolytic Use Disorder Axis II: Deferred Axis IV: economic problems, housing problems, problems related to legal system/crime and problems with primary support group Axis V: 21-30 behavior considerably influenced by delusions or hallucinations OR serious impairment in judgment, communication OR inability to function in almost all  areas  Past Medical History:  Past Medical History  Diagnosis Date  . Borderline personality disorder   . Suicidal thoughts   . Addiction to drug     History reviewed. No pertinent past surgical history.  Family History: No family history on file.  Social History:  reports that she has been smoking Cigarettes.  She has been smoking about 0.50 packs per day. She does not have any smokeless tobacco history on file. She reports that she uses illicit drugs (Oxycodone and Cocaine) about 42 times per week. She reports that she does not drink alcohol.  Additional Social History:     CIWA: CIWA-Ar BP: 106/60 mmHg Pulse Rate: 78 COWS:    Allergies:  Allergies  Allergen Reactions  . Abilify [Aripiprazole] Other (See Comments)    Makes body crawl    Home Medications:  (Not in a hospital admission)  OB/GYN Status:  Patient's last menstrual period was 11/06/2013.  General Assessment Data Location of Assessment: [facility] ED ACT Assessment: Yes Is this a Tele or Face-to-Face Assessment?: Tele Assessment Is this an Initial Assessment or a Re-assessment for this encounter?: Initial Assessment Living Arrangements: Other relatives Can pt return to current living arrangement?: No (Patient reports being kicked out of housing.) Admission Status: Voluntary Is patient capable of signing voluntary admission?: Yes Transfer from: Home Referral Source: Self/Family/Friend  Medical Screening Exam [facility] Walk-in ONLY) Medical Exam completed: Yes  [facility] Crisis Care Plan Living Arrangements: Other relatives Name of Psychiatrist: [facility] Name of Therapist: None  Education Status Is patient currently in school?: No Current Grade: N/A Highest grade of school patient  has completed: N/A Name of school: N/A Contact person: N/A  Risk to self with the past 6 months Suicidal Ideation: Yes-Currently Present Suicidal Intent: Yes-Currently Present Is patient at risk for suicide?:  No Suicidal Plan?: No Access to Means: No What has been your use of drugs/alcohol within the last 12 months?: Opiates, Benzos, Cocaine Previous Attempts/Gestures: No How many times?: 0 Other Self Harm Risks: 0 Triggers for Past Attempts: None known Intentional Self Injurious Behavior: None Family Suicide History: Unknown Recent stressful life event(s): Other (Comment), Legal Issues Persecutory voices/beliefs?: No Depression: Yes Depression Symptoms: Feeling worthless/self pity, Guilt Substance abuse history and/or treatment for substance abuse?: Yes Suicide prevention information given to non-admitted patients: Not applicable  Risk to Others within the past 6 months Homicidal Ideation: No Thoughts of Harm to Others: No Current Homicidal Intent: No Current Homicidal Plan: No Access to Homicidal Means: No Identified Victim: N/A History of harm to others?: No Assessment of Violence: None Noted Violent Behavior Description: N/A Does patient have access to weapons?: No Criminal Charges Pending?: No Does patient have a court date: No  Psychosis Hallucinations: None noted Delusions: None noted  Mental Status Report Appear/Hygiene: In hospital gown Eye Contact: Poor Motor Activity: Restlessness Speech: Logical/coherent Level of Consciousness: Alert Mood: Depressed, Anxious Affect: Anxious, Depressed Anxiety Level: Moderate Thought Processes: Coherent Judgement: Impaired Orientation: Person, Place, Time, Situation Obsessive Compulsive Thoughts/Behaviors: None  Cognitive Functioning Concentration: Decreased Memory: Recent Intact, Remote Intact IQ: Average Insight: Poor Impulse Control: Poor Appetite: Poor Weight Loss: 0 Weight Gain: 0 Sleep: Decreased Total Hours of Sleep: 6 (flucuates) Vegetative Symptoms: None  ADLScreening [place]) Patient's cognitive ability adequate to safely complete daily activities?: Yes Patient able to express need for  assistance with ADLs?: Yes Independently performs ADLs?: Yes (appropriate for developmental age)  Prior Inpatient Therapy Prior Inpatient Therapy: Yes Prior Therapy Dates: 2015 [facility] Reason for Treatment: Bipolar  Prior Outpatient Therapy Prior Outpatient Therapy: Yes Prior Therapy Dates: January 2016 [facility] Reason for Treatment: Bipolar  ADL Screening (condition at time of admission) Patient's cognitive ability adequate to safely complete daily activities?: Yes Is the patient deaf or have difficulty hearing?: No Does the patient have difficulty seeing, even when wearing glasses/contacts?: No Does the patient have difficulty concentrating, remembering, or making decisions?: No Patient able to express need for assistance with ADLs?: Yes Does the patient have difficulty dressing or bathing?: No Independently performs ADLs?: Yes (appropriate for developmental age) Does the patient have difficulty walking or climbing stairs?: No Weakness of Legs: None Weakness of Arms/Hands: None  Home Assistive Devices/Equipment Home Assistive Devices/Equipment: None    Abuse/Neglect Assessment (Assessment to be complete while patient is alone) Physical Abuse: Denies Verbal Abuse: Denies Sexual Abuse: Denies Exploitation of patient/patient's resources: Denies Self-Neglect: Denies Values / Beliefs Cultural Requests During Hospitalization: None Spiritual Requests During Hospitalization: None   Advance Directives (For Healthcare) Does patient have an advance directive?: No    Additional Information 1:1 In Past 12 Months?: Yes CIRT Risk: No Elopement Risk: No Does patient have medical clearance?: Yes     Disposition:  Disposition Initial Assessment Completed for this Encounter: Yes Disposition of Patient: Inpatient treatment program Type of inpatient treatment program: Adult  On  Site Evaluation by:   Reviewed with Physician:    Dey-Johnson,Athaliah Baumbach 06/01/2014 1:01 AM

## 2014-06-01 NOTE — ED Notes (Addendum)
PER PHARMACIST, AWARE PT IS PREGNANT -  MAY ADMINISTER CLONIDINE.

## 2014-06-02 DIAGNOSIS — O99320 Drug use complicating pregnancy, unspecified trimester: Secondary | ICD-10-CM

## 2014-06-02 DIAGNOSIS — F1914 Other psychoactive substance abuse with psychoactive substance-induced mood disorder: Secondary | ICD-10-CM

## 2014-06-02 DIAGNOSIS — R45851 Suicidal ideations: Secondary | ICD-10-CM | POA: Diagnosis not present

## 2014-06-02 DIAGNOSIS — Z3A00 Weeks of gestation of pregnancy not specified: Secondary | ICD-10-CM | POA: Diagnosis not present

## 2014-06-02 NOTE — Consult Note (Signed)
[facility] Face-to-Face Psychiatry Consult   Reason for Consult:  Polysubstance abuse, pregnant and having suicidal thoughts Referring Physician:  ED MD Patient Identification: Christina Burch MRN:  [id number] Principal Diagnosis: Substance induced mood disorder Diagnosis:   Patient Active Problem List   Diagnosis Date Noted  . Opioid dependence [F11.20] 12/18/2013  . MDD (major depressive disorder), recurrent severe, without psychosis [F33.2] 12/17/2013    Total Time spent with patient: 30 minutes  Subjective:   Christina Burch is a 34 y.o. female patient admitted with suicidal thoughts, polysubstance abuse and pregnancy.  HPI:  Ms Christina Burch has been using heroin and valium while pregnant.  Says she has suicidal thoughts but no intent but cannot contract for safety if sent home.  She would be okay if in a treatment facility she believes.  She has a long history of polysubstance abuse and cannot stop on her own.  She also has had suicidal thoughts for many years she says.  She does plan to keep the baby. HPI Elements:   Location:  addiction while pregnant. Quality:  uses heroin and valium daily. Severity:  cannot stop on her own. Timing:  using and cannot stop. Duration:  years of drugs and suicidal thoughts. Context:  as above.  Past Medical History:  Past Medical History  Diagnosis Date  . Borderline personality disorder   . Suicidal thoughts   . Addiction to drug   . Pregnant     Past Surgical History  Procedure Laterality Date  . Cholecystectomy     Family History: No family history on file. Social History:  History  Alcohol Use No     History  Drug Use  . 42.00 per week  . Special: Oxycodone, Cocaine    History   Social History  . Marital Status: Single    Spouse Name: N/A  . Number of Children: N/A  . Years of Education: N/A   Social History Main Topics  . Smoking status: Current Every Day Smoker -- 0.50 packs/day    Types: Cigarettes  . Smokeless tobacco: Not on  file  . Alcohol Use: No  . Drug Use: 42.00 per week    Special: Oxycodone, Cocaine  . Sexual Activity: Yes    Birth Control/ Protection: None   Other Topics Concern  . None   Social History Narrative   Additional Social History:                          Allergies:   Allergies  Allergen Reactions  . Abilify [Aripiprazole] Other (See Comments)    Makes body crawl  . Clonidine Derivatives Anxiety    Vitals: Blood pressure 100/54, pulse 79, temperature 98.6 F (37 C), temperature source Oral, resp. rate 18, height 5' 4.5" (1.638 m), weight 81.647 kg (180 lb), last menstrual period 11/06/2013, SpO2 99 %.  Risk to Self: Suicidal Ideation: Yes-Currently Present Suicidal Intent: Yes-Currently Present Is patient at risk for suicide?: No Suicidal Plan?: No Access to Means: No What has been your use of drugs/alcohol within the last 12 months?: Opiates, Benzos, Cocaine How many times?: 0 Other Self Harm Risks: 0 Triggers for Past Attempts: None known Intentional Self Injurious Behavior: None Risk to Others: Homicidal Ideation: No Thoughts of Harm to Others: No Current Homicidal Intent: No Current Homicidal Plan: No Access to Homicidal Means: No Identified Victim: N/A History of harm to others?: No Assessment of Violence: None Noted Violent Behavior Description: N/A Does patient have access to  weapons?: No Criminal Charges Pending?: No Does patient have a court date: No Prior Inpatient Therapy: Prior Inpatient Therapy: Yes Prior Therapy Dates: 2015 Prior Therapy Facilty/Provider(s): [facility] Reason for Treatment: Bipolar Prior Outpatient Therapy: Prior Outpatient Therapy: Yes Prior Therapy Dates: January 2016 Prior Therapy Facilty/Provider(s): [facility], [facility] (med management) Reason for Treatment: Bipolar  Current Facility-Administered Medications  Medication Dose Route Frequency Provider Last Rate Last Dose  . benzocaine (ORAJEL) 10 % mucosal gel    Mouth/Throat QID PRN Carlyle Dolly, PA-C   1 application at 06/01/14 2124   Current Outpatient Prescriptions  Medication Sig Dispense Refill  . acetaminophen (TYLENOL) 500 MG tablet Take 1,500 mg by mouth every 6 (six) hours as needed (pain).    . benzocaine (ORAJEL) 10 % mucosal gel Use as directed in the mouth or throat 4 (four) times daily as needed for mouth pain. 5.3 g 0  . buprenorphine (SUBUTEX) 2 MG SUBL SL tablet Place 2 tablets (4 mg total) under the tongue 2 (two) times daily. (See hand written prescription from the MD): For opioid dependence    . gabapentin (NEURONTIN) 300 MG capsule Take 1 capsule (300 mg total) by mouth 3 (three) times daily. For agitation/substance withdrawal syndrome 90 capsule 0  . ibuprofen (ADVIL,MOTRIN) 200 MG tablet Take 1,000 mg by mouth every 6 (six) hours as needed (pain).    Marland Kitchen albuterol (PROVENTIL HFA;VENTOLIN HFA) 108 (90 BASE) MCG/ACT inhaler Inhale 2 puffs into the lungs every 6 (six) hours as needed for wheezing or shortness of breath (chest tightness).    Marland Kitchen amoxicillin-clavulanate (AUGMENTIN) 875-125 MG per tablet Take 1 tablet by mouth every 12 (twelve) hours. For upper respiratory infection (Patient not taking: Reported on 05/31/2014)    . ARIPiprazole (ABILIFY) 15 MG tablet Take 1 tablet (15 mg total) by mouth daily. For mood control (Patient not taking: Reported on 05/31/2014) 30 tablet 0  . dextromethorphan-guaiFENesin (MUCINEX DM) 30-600 MG per 12 hr tablet Take 1 tablet by mouth 2 (two) times daily. For cough (Patient not taking: Reported on 05/31/2014)    . Oxcarbazepine (TRILEPTAL) 300 MG tablet Take 1 tablet (300 mg total) by mouth 2 (two) times daily. For mood stabilization (Patient not taking: Reported on 05/31/2014) 60 tablet 0  . oxybutynin (DITROPAN) 5 MG tablet Take 1 tablet (5 mg total) by mouth daily. For bladder spasm (Patient not taking: Reported on 05/31/2014)      Musculoskeletal: Strength & Muscle Tone: within normal  limits Gait & Station: normal Patient leans: N/A  Psychiatric Specialty Exam: Physical Exam  ROS  Blood pressure 100/54, pulse 79, temperature 98.6 F (37 C), temperature source Oral, resp. rate 18, height 5' 4.5" (1.638 m), weight 81.647 kg (180 lb), last menstrual period 11/06/2013, SpO2 99 %.Body mass index is 30.43 kg/(m^2).  General Appearance: Casual  Eye Contact::  Good  Speech:  Clear and Coherent  Volume:  Normal  Mood:  Depressed  Affect:  Congruent  Thought Process:  Coherent and Logical  Orientation:  Full (Time, Place, and Person)  Thought Content:  Negative  Suicidal Thoughts:  Yes.  without intent/plan  Homicidal Thoughts:  No  Memory:  Immediate;   Good Recent;   Good Remote;   Good  Judgement:  Fair  Insight:  Shallow  Psychomotor Activity:  Normal  Concentration:  Good  Recall:  Good  Fund of Knowledge:Good  Language: Good  Akathisia:  Negative  Handed:  Right  AIMS (if indicated):   0  Assets:  Communication Skills Desire for Improvement Housing Physical Health  ADL's:  Intact  Cognition: WNL  Sleep:      Medical Decision Making: Established Problem, Stable/Improving (1)  Treatment Plan Summary: Daily contact with patient to assess and evaluate symptoms and progress in treatment, Medication management and Plan will seek drug treatment program that can deal with the pregnancy.  Suicidal thoughts shoud not be an issue as long as she is in a bed somewhere.  Plan:  needs placement in a program that can  deal with the combination of drug use and pregnancy. Disposition: hold in ED until a bed can be found as described above.  Benjaman Pott 06/02/2014 12:44 PM

## 2014-06-02 NOTE — ED Notes (Signed)
Patient appears flat, sad. Denies SI, HI, AVH at present. Reports feeling "alone". States that her feelings of depression are increasing. Reports toothache continues.   Encouragement offered. Declines Orajel at present.  Q 15 safety checks continue.

## 2014-06-02 NOTE — Progress Notes (Signed)
[facility]. Per [facility] patient can return for outpatient services once patient completes treatment. [facility] request an estimate of when patient may complete treatment to ensure continuation of care. CSW confirmed with Mr. Letitia LibraJohnston at [facility] [place] that patient program would be 10-14 days. CSW called [facility] back however office currently closed for lunch until 145pm.  Byrd HesselbachKristen Zaina Jenkin, LCSW [phone]  ED CSW 06/02/2014 12:43 PM

## 2014-06-02 NOTE — Progress Notes (Signed)
Pt requesting to go to [facility] [place] for perinatal treatment. Patient shares that she [facility]. Patient reports that the practice would accept her back if she went in for treatment at [facility]. CSW spoke with [facility] who shared that patietn must have a letter from [facility] that states they will continue dosing upon completion of their program.  [facility] at [phone] and was forwarded to Engineer, manufacturingpractice manager, [facility]. CSW awaiting return call.   Byrd HesselbachKristen Anessa Charley, LCSW [phone]  ED CSW 06/02/2014 10:32 AM

## 2014-06-03 DIAGNOSIS — R45851 Suicidal ideations: Secondary | ICD-10-CM | POA: Diagnosis not present

## 2014-06-03 DIAGNOSIS — O99320 Drug use complicating pregnancy, unspecified trimester: Secondary | ICD-10-CM | POA: Diagnosis not present

## 2014-06-03 DIAGNOSIS — Z3A00 Weeks of gestation of pregnancy not specified: Secondary | ICD-10-CM | POA: Diagnosis not present

## 2014-06-03 DIAGNOSIS — F1914 Other psychoactive substance abuse with psychoactive substance-induced mood disorder: Secondary | ICD-10-CM | POA: Diagnosis not present

## 2014-06-03 LAB — POC URINE PREG, ED: PREG TEST UR: POSITIVE — AB

## 2014-06-03 MED ORDER — GABAPENTIN 600 MG PO TABS: 300.0000 mg | ORAL_TABLET | Freq: Three times a day (TID) | ORAL | Status: DC

## 2014-06-03 MED ORDER — ALBUTEROL SULFATE HFA 108 (90 BASE) MCG/ACT IN AERS: 2.0000 | INHALATION_SPRAY | Freq: Four times a day (QID) | RESPIRATORY_TRACT | Status: DC | PRN

## 2014-06-03 NOTE — Progress Notes (Signed)
CSW reached out to [facility] to inquire about referral. However, representative states that she is not able to check pt status at this time due to their computer system being down.  CSW will continue to check status of referral.  Trish MageBrittney Sherrill Mckamie, LCSWA [phone] ED CSW 06/03/2014 6:00 PM

## 2014-06-03 NOTE — Consult Note (Signed)
[facility] Face-to-Face Psychiatry Consult   Reason for Consult:  Polysubstance abuse, pregnant and having suicidal thoughts Referring Physician:  ED MD Patient Identification: Christina Burch MRN:  [id number] Principal Diagnosis: Substance induced mood disorder Diagnosis:   Patient Active Problem List   Diagnosis Date Noted  . Opioid dependence [F11.20] 12/18/2013  . MDD (major depressive disorder), recurrent severe, without psychosis [F33.2] 12/17/2013    Total Time spent with patient: 30 minutes  Subjective:   Christina Burch is a 34 y.o. female patient admitted with suicidal thoughts, polysubstance abuse and pregnancy.  HPI:  Ms Prestia has been using heroin and valium while pregnant.  Says she has suicidal thoughts but no intent but cannot contract for safety if sent home.  She would be okay if in a treatment facility she believes.  She has a long history of polysubstance abuse and cannot stop on her own.  She also has had suicidal thoughts for many years she says.  She does plan to keep the baby. HPI Elements:   Location:  addiction while pregnant. Quality:  uses heroin and valium daily. Severity:  cannot stop on her own. Timing:  using and cannot stop. Duration:  years of drugs and suicidal thoughts. Context:  as above.  Past Medical History:  Past Medical History  Diagnosis Date  . Borderline personality disorder   . Suicidal thoughts   . Addiction to drug   . Pregnant     Past Surgical History  Procedure Laterality Date  . Cholecystectomy     Family History: No family history on file. Social History:  History  Alcohol Use No     History  Drug Use  . 42.00 per week  . Special: Oxycodone, Cocaine    History   Social History  . Marital Status: Single    Spouse Name: N/A  . Number of Children: N/A  . Years of Education: N/A   Social History Main Topics  . Smoking status: Current Every Day Smoker -- 0.50 packs/day    Types: Cigarettes  . Smokeless tobacco: Not on  file  . Alcohol Use: No  . Drug Use: 42.00 per week    Special: Oxycodone, Cocaine  . Sexual Activity: Yes    Birth Control/ Protection: None   Other Topics Concern  . None   Social History Narrative   Additional Social History:                          Allergies:   Allergies  Allergen Reactions  . Abilify [Aripiprazole] Other (See Comments)    Makes body crawl  . Clonidine Derivatives Anxiety    Vitals: Blood pressure 103/51, pulse 85, temperature 98.4 F (36.9 C), temperature source Oral, resp. rate 16, height 5' 4.5" (1.638 m), weight 81.647 kg (180 lb), last menstrual period 11/06/2013, SpO2 99 %.  Risk to Self: Suicidal Ideation: Yes-Currently Present Suicidal Intent: Yes-Currently Present Is patient at risk for suicide?: No Suicidal Plan?: No Access to Means: No What has been your use of drugs/alcohol within the last 12 months?: Opiates, Benzos, Cocaine How many times?: 0 Other Self Harm Risks: 0 Triggers for Past Attempts: None known Intentional Self Injurious Behavior: None Risk to Others: Homicidal Ideation: No Thoughts of Harm to Others: No Current Homicidal Intent: No Current Homicidal Plan: No Access to Homicidal Means: No Identified Victim: N/A History of harm to others?: No Assessment of Violence: None Noted Violent Behavior Description: N/A Does patient have access to  weapons?: No Criminal Charges Pending?: No Does patient have a court date: No Prior Inpatient Therapy: Prior Inpatient Therapy: Yes Prior Therapy Dates: 2015 Prior Therapy Facilty/Provider(s): [facility] Reason for Treatment: Bipolar Prior Outpatient Therapy: Prior Outpatient Therapy: Yes Prior Therapy Dates: January 2016 Prior Therapy Facilty/Provider(s): [facility], [facility] (med management) Reason for Treatment: Bipolar  Current Facility-Administered Medications  Medication Dose Route Frequency Provider Last Rate Last Dose  . albuterol (PROVENTIL HFA;VENTOLIN HFA) 108  (90 BASE) MCG/ACT inhaler 2 puff  2 puff Inhalation Q6H PRN Nanine Means, NP      . benzocaine (ORAJEL) 10 % mucosal gel   Mouth/Throat QID PRN Carlyle Dolly, PA-C   1 application at 06/01/14 2124   Current Outpatient Prescriptions  Medication Sig Dispense Refill  . acetaminophen (TYLENOL) 500 MG tablet Take 1,500 mg by mouth every 6 (six) hours as needed (pain).    . benzocaine (ORAJEL) 10 % mucosal gel Use as directed in the mouth or throat 4 (four) times daily as needed for mouth pain. 5.3 g 0  . buprenorphine (SUBUTEX) 2 MG SUBL SL tablet Place 2 tablets (4 mg total) under the tongue 2 (two) times daily. (See hand written prescription from the MD): For opioid dependence    . gabapentin (NEURONTIN) 300 MG capsule Take 1 capsule (300 mg total) by mouth 3 (three) times daily. For agitation/substance withdrawal syndrome 90 capsule 0  . ibuprofen (ADVIL,MOTRIN) 200 MG tablet Take 1,000 mg by mouth every 6 (six) hours as needed (pain).    Marland Kitchen albuterol (PROVENTIL HFA;VENTOLIN HFA) 108 (90 BASE) MCG/ACT inhaler Inhale 2 puffs into the lungs every 6 (six) hours as needed for wheezing or shortness of breath (chest tightness).    Marland Kitchen amoxicillin-clavulanate (AUGMENTIN) 875-125 MG per tablet Take 1 tablet by mouth every 12 (twelve) hours. For upper respiratory infection (Patient not taking: Reported on 05/31/2014)    . ARIPiprazole (ABILIFY) 15 MG tablet Take 1 tablet (15 mg total) by mouth daily. For mood control (Patient not taking: Reported on 05/31/2014) 30 tablet 0  . dextromethorphan-guaiFENesin (MUCINEX DM) 30-600 MG per 12 hr tablet Take 1 tablet by mouth 2 (two) times daily. For cough (Patient not taking: Reported on 05/31/2014)    . Oxcarbazepine (TRILEPTAL) 300 MG tablet Take 1 tablet (300 mg total) by mouth 2 (two) times daily. For mood stabilization (Patient not taking: Reported on 05/31/2014) 60 tablet 0  . oxybutynin (DITROPAN) 5 MG tablet Take 1 tablet (5 mg total) by mouth daily. For bladder  spasm (Patient not taking: Reported on 05/31/2014)      Musculoskeletal: Strength & Muscle Tone: within normal limits Gait & Station: normal Patient leans: N/A  Psychiatric Specialty Exam: Physical Exam  ROS  Blood pressure 103/51, pulse 85, temperature 98.4 F (36.9 C), temperature source Oral, resp. rate 16, height 5' 4.5" (1.638 m), weight 81.647 kg (180 lb), last menstrual period 11/06/2013, SpO2 99 %.Body mass index is 30.43 kg/(m^2).  General Appearance: Casual  Eye Contact::  Good  Speech:  Clear and Coherent  Volume:  Normal  Mood:  Depressed  Affect:  Congruent  Thought Process:  Coherent and Logical  Orientation:  Full (Time, Place, and Person)  Thought Content:  Negative  Suicidal Thoughts:  Yes.  without intent/plan  Homicidal Thoughts:  No  Memory:  Immediate;   Good Recent;   Good Remote;   Good  Judgement:  Fair  Insight:  Shallow  Psychomotor Activity:  Normal  Concentration:  Good  Recall:  Good  Fund of Knowledge:Good  Language: Good  Akathisia:  Negative  Handed:  Right  AIMS (if indicated):   0  Assets:  Communication Skills Desire for Improvement Housing Physical Health  ADL's:  Intact  Cognition: WNL  Sleep:      Medical Decision Making: Established Problem, Stable/Improving (1)  Treatment Plan Summary: Daily contact with patient to assess and evaluate symptoms and progress in treatment, Medication management and Plan will seek drug treatment program that can deal with the pregnancy.  Suicidal thoughts shoud not be an issue as long as she is in a bed somewhere.  Plan:  needs placement in a program that can  deal with the combination of drug use and pregnancy. Disposition: hold in ED until a bed can be found as described above.  Benjaman PottAYLOR,GERALD D 06/03/2014 2:12 PM   [name] says every thing is the same today.  Okay while in the hospital but not safe if discharged.  She seems to be withdrawing comfortably and is awaiting a bed in a drug rehab  facility which can also treat the depression.

## 2014-06-03 NOTE — Progress Notes (Signed)
CS[facility] [place] [facility] [place] program. CSW faxed letter to [facility] as requested by [facility] and [facility].. Patient requesting [facility] for treatment as pt able to return to outpatient clinic only if completing inpatient treatment first. CSW confirmed with [facility] letter was received.   Byrd HesselbachKristen Kevyn Wengert, LCSW [phone]  ED CSW 06/03/2014 9:52 AM

## 2014-06-03 NOTE — Progress Notes (Signed)
CSW confirmed with [facility] that patient information is still under review with MD. CSW awaiting return call.   Byrd HesselbachKristen Muzammil Bruins, LCSW [phone]  ED CSW 06/03/2014 1:13 PM

## 2014-06-04 DIAGNOSIS — F1920 Other psychoactive substance dependence, uncomplicated: Secondary | ICD-10-CM | POA: Diagnosis not present

## 2014-06-04 DIAGNOSIS — R45851 Suicidal ideations: Secondary | ICD-10-CM | POA: Diagnosis not present

## 2014-06-04 DIAGNOSIS — F332 Major depressive disorder, recurrent severe without psychotic features: Secondary | ICD-10-CM | POA: Diagnosis not present

## 2014-06-04 NOTE — Consult Note (Signed)
[facility]   Reason for Consult:  Suicidal ideations Referring Physician:  EDP Patient Identification: Micael Hampshiremanda Parcher MRN:  [id number] Principal Diagnosis: MDD (major depressive disorder), recurrent severe, without psychosis Diagnosis:   Patient Active Problem List   Diagnosis Date Noted  . Polysubstance dependence [F19.20] 06/04/2014    Priority: High  . Suicidal ideations [R45.851] 06/04/2014    Priority: High  . Opioid dependence [F11.20] 12/18/2013    Priority: High  . MDD (major depressive disorder), recurrent severe, without psychosis [F33.2] 12/17/2013    Priority: High    Total Time spent with patient: 30 minutes  Subjective:   Micael Hampshiremanda Revere is a [age] y.o. female patient admitted with depression and polysubstance .  HPI:  [facility]  She remains depressed with suicidal ideations.  Marchelle Folksmanda is pregnant with polysubstance dependence (daily use, including opioids) with support from her sister and the father of the baby.  Stable for discharge. HPI Elements:   Location:  generalized. Quality:  acute. Severity:  severe. Timing:  constant. Duration:  few days prior to admission. Context:  stressors, drug abuse.  Past Medical History:  Past Medical History  Diagnosis Date  . Borderline personality disorder   . Suicidal thoughts   . Addiction to drug   . Pregnant     Past Surgical History  Procedure Laterality Date  . Cholecystectomy     Family History: No family history on file. Social History:  History  Alcohol Use No     History  Drug Use  . 42.00 per week  . Special: Oxycodone, Cocaine    History   Social History  . Marital Status: Single    Spouse Name: N/A  . Number of Children: N/A  . Years of Education: N/A   Social History Main Topics  . Smoking status: Current Every Day Smoker -- 0.50 packs/day    Types: Cigarettes  . Smokeless tobacco: Not on file  . Alcohol  Use: No  . Drug Use: 42.00 per week    Special: Oxycodone, Cocaine  . Sexual Activity: Yes    Birth Control/ Protection: None   Other Topics Concern  . None   Social History Narrative   Additional Social History:                          Allergies:   Allergies  Allergen Reactions  . Abilify [Aripiprazole] Other (See Comments)    Makes body crawl  . Clonidine Derivatives Anxiety    Vitals: Blood pressure 106/54, pulse 88, temperature 98.1 F (36.7 C), temperature source Oral, resp. rate 18, height 5' 4.5" (1.638 m), weight 180 lb (81.647 kg), last menstrual period 11/06/2013, SpO2 99 %.  Risk to Self: Suicidal Ideation: Yes-Currently Present Suicidal Intent: Yes-Currently Present Is patient at risk for suicide?: No Suicidal Plan?: No Access to Means: No What has been your use of drugs/alcohol within the last 12 months?: Opiates, Benzos, Cocaine How many times?: 0 Other Self Harm Risks: 0 Triggers for Past Attempts: None known Intentional Self Injurious Behavior: None Risk to Others: Homicidal Ideation: No Thoughts of Harm to Others: No Current Homicidal Intent: No Current Homicidal Plan: No Access to Homicidal Means: No Identified Victim: N/A History of harm to others?: No Assessment of Violence: None Noted Violent Behavior Description: N/A Does patient have access to weapons?: No Criminal Charges Pending?: No Does patient have a court date: No Prior Inpatient  Therapy: Prior Inpatient Therapy: Yes Prior Therapy Dates: 2015 Prior Therapy Facilty/Provider(s): [facility] Reason for Treatment: Bipolar Prior Outpatient Therapy: Prior Outpatient Therapy: Yes Prior Therapy Dates: January 2016 Prior Therapy Facilty/Provider(s): [facility] (med management) Reason for Treatment: Bipolar  Current Facility-Administered Medications  Medication Dose Route Frequency Provider Last Rate Last Dose  . albuterol (PROVENTIL HFA;VENTOLIN HFA) 108 (90 BASE) MCG/ACT  inhaler 2 puff  2 puff Inhalation Q6H PRN Nanine Means, NP      . benzocaine (ORAJEL) 10 % mucosal gel   Mouth/Throat QID PRN Carlyle Dolly, PA-C   1 application at 06/01/14 2124   Current Outpatient Prescriptions  Medication Sig Dispense Refill  . acetaminophen (TYLENOL) 500 MG tablet Take 1,500 mg by mouth every 6 (six) hours as needed (pain).    . benzocaine (ORAJEL) 10 % mucosal gel Use as directed in the mouth or throat 4 (four) times daily as needed for mouth pain. 5.3 g 0  . buprenorphine (SUBUTEX) 2 MG SUBL SL tablet Place 2 tablets (4 mg total) under the tongue 2 (two) times daily. (See hand written prescription from the MD): For opioid dependence    . gabapentin (NEURONTIN) 300 MG capsule Take 1 capsule (300 mg total) by mouth 3 (three) times daily. For agitation/substance withdrawal syndrome 90 capsule 0  . ibuprofen (ADVIL,MOTRIN) 200 MG tablet Take 1,000 mg by mouth every 6 (six) hours as needed (pain).    Marland Kitchen albuterol (PROVENTIL HFA;VENTOLIN HFA) 108 (90 BASE) MCG/ACT inhaler Inhale 2 puffs into the lungs every 6 (six) hours as needed for wheezing or shortness of breath (chest tightness).    Marland Kitchen amoxicillin-clavulanate (AUGMENTIN) 875-125 MG per tablet Take 1 tablet by mouth every 12 (twelve) hours. For upper respiratory infection (Patient not taking: Reported on 05/31/2014)    . ARIPiprazole (ABILIFY) 15 MG tablet Take 1 tablet (15 mg total) by mouth daily. For mood control (Patient not taking: Reported on 05/31/2014) 30 tablet 0  . dextromethorphan-guaiFENesin (MUCINEX DM) 30-600 MG per 12 hr tablet Take 1 tablet by mouth 2 (two) times daily. For cough (Patient not taking: Reported on 05/31/2014)    . Oxcarbazepine (TRILEPTAL) 300 MG tablet Take 1 tablet (300 mg total) by mouth 2 (two) times daily. For mood stabilization (Patient not taking: Reported on 05/31/2014) 60 tablet 0  . oxybutynin (DITROPAN) 5 MG tablet Take 1 tablet (5 mg total) by mouth daily. For bladder spasm (Patient not  taking: Reported on 05/31/2014)      Musculoskeletal: Strength & Muscle Tone: within normal limits Gait & Station: normal Patient leans: N/A  Psychiatric Specialty Exam:     Blood pressure 106/54, pulse 88, temperature 98.1 F (36.7 C), temperature source Oral, resp. rate 18, height 5' 4.5" (1.638 m), weight 180 lb (81.647 kg), last menstrual period 11/06/2013, SpO2 99 %.Body mass index is 30.43 kg/(m^2).  General Appearance: Disheveled  Eye Solicitor::  Fair  Speech:  Normal Rate  Volume:  Normal  Mood:  Depressed  Affect:  Blunt  Thought Process:  Coherent  Orientation:  Full (Time, Place, and Person)  Thought Content:  Rumination  Suicidal Thoughts:  Yes.  with intent/plan  Homicidal Thoughts:  No  Memory:  Immediate;   Fair Recent;   Fair Remote;   Fair  Judgement:  Poor  Insight:  Fair  Psychomotor Activity:  Decreased  Concentration:  Fair  Recall:  [employer] of Knowledge:Fair  Language: Fair  Akathisia:  No  Handed:  Right  AIMS (if  indicated):     Assets:  Communication Skills Desire for Improvement Housing Intimacy Leisure Time Physical Health Resilience Social Support  ADL's:  Intact  Cognition: WNL  Sleep:      Medical Decision Making: Review of Psycho-Social Stressors (1), Review or order clinical lab tests (1) and Review of Medication Regimen & Side Effects (2)  Treatment Plan Summary: Daily contact with patient to assess and evaluate symptoms and progress in treatment, Medication management and Plan Transfer to [facility] to [facility] for stabilization  Plan:  Recommend psychiatric Inpatient admission when medically cleared. Disposition: Transfer to [facility] to [facility] for stabilization  Nanine Means, PMH-NP 06/04/2014 8:43 AM

## 2014-06-04 NOTE — ED Notes (Signed)
Patient calm, cooperative. Continues to endorse feelings of depression. Reports back pain.  Encouragement offered.  Q 15 safety checks continue.

## 2014-06-04 NOTE — BH Assessment (Signed)
[facility] Assessment Progress Note  At 10:06, Santina Evansatherine at [facility] [place] called.  Pt has been accepted to their facility by Dr Lamar LaundrySonya Longest, Rm [id number].  Please call report to [phone].  Nelly RoutArchana Kumar, MD concurs with this decision.  Pt's nurse has been notified.  Pt is to be [place] [facility].  Kendrew Paci, MA Triage Specialist 06/04/2014 @ 10:14

## 2014-06-16 DIAGNOSIS — R1011 Right upper quadrant pain: Secondary | ICD-10-CM | POA: Diagnosis not present

## 2014-06-16 DIAGNOSIS — R110 Nausea: Secondary | ICD-10-CM | POA: Diagnosis not present

## 2014-06-16 DIAGNOSIS — Z3A29 29 weeks gestation of pregnancy: Secondary | ICD-10-CM | POA: Diagnosis not present

## 2014-06-16 DIAGNOSIS — O0930 Supervision of pregnancy with insufficient antenatal care, unspecified trimester: Secondary | ICD-10-CM | POA: Diagnosis not present

## 2014-06-16 DIAGNOSIS — O26893 Other specified pregnancy related conditions, third trimester: Secondary | ICD-10-CM | POA: Diagnosis not present

## 2014-06-17 DIAGNOSIS — Z3A29 29 weeks gestation of pregnancy: Secondary | ICD-10-CM | POA: Diagnosis not present

## 2014-06-17 DIAGNOSIS — R110 Nausea: Secondary | ICD-10-CM | POA: Diagnosis not present

## 2014-06-17 DIAGNOSIS — O26893 Other specified pregnancy related conditions, third trimester: Secondary | ICD-10-CM | POA: Diagnosis not present

## 2014-06-17 DIAGNOSIS — O99323 Drug use complicating pregnancy, third trimester: Secondary | ICD-10-CM | POA: Diagnosis not present

## 2014-06-17 DIAGNOSIS — R1011 Right upper quadrant pain: Secondary | ICD-10-CM | POA: Diagnosis not present

## 2014-06-17 DIAGNOSIS — O0930 Supervision of pregnancy with insufficient antenatal care, unspecified trimester: Secondary | ICD-10-CM | POA: Diagnosis not present

## 2014-06-23 DIAGNOSIS — O0993 Supervision of high risk pregnancy, unspecified, third trimester: Secondary | ICD-10-CM | POA: Diagnosis not present

## 2014-06-23 DIAGNOSIS — Z124 Encounter for screening for malignant neoplasm of cervix: Secondary | ICD-10-CM | POA: Diagnosis not present

## 2014-07-03 DIAGNOSIS — R141 Gas pain: Secondary | ICD-10-CM | POA: Diagnosis not present

## 2014-07-03 DIAGNOSIS — O0993 Supervision of high risk pregnancy, unspecified, third trimester: Secondary | ICD-10-CM | POA: Diagnosis not present

## 2014-07-03 DIAGNOSIS — D649 Anemia, unspecified: Secondary | ICD-10-CM | POA: Diagnosis not present

## 2014-07-03 DIAGNOSIS — Z862 Personal history of diseases of the blood and blood-forming organs and certain disorders involving the immune mechanism: Secondary | ICD-10-CM | POA: Diagnosis not present

## 2014-07-03 DIAGNOSIS — R112 Nausea with vomiting, unspecified: Secondary | ICD-10-CM | POA: Diagnosis not present

## 2014-07-03 DIAGNOSIS — Z3A34 34 weeks gestation of pregnancy: Secondary | ICD-10-CM | POA: Diagnosis not present

## 2014-07-03 DIAGNOSIS — Z331 Pregnant state, incidental: Secondary | ICD-10-CM | POA: Diagnosis not present

## 2014-07-03 DIAGNOSIS — R8299 Other abnormal findings in urine: Secondary | ICD-10-CM | POA: Diagnosis not present

## 2014-07-16 DIAGNOSIS — O99323 Drug use complicating pregnancy, third trimester: Secondary | ICD-10-CM | POA: Diagnosis not present

## 2014-07-16 DIAGNOSIS — O0993 Supervision of high risk pregnancy, unspecified, third trimester: Secondary | ICD-10-CM | POA: Diagnosis not present

## 2014-07-16 DIAGNOSIS — O98419 Viral hepatitis complicating pregnancy, unspecified trimester: Secondary | ICD-10-CM | POA: Diagnosis not present

## 2014-07-16 DIAGNOSIS — B182 Chronic viral hepatitis C: Secondary | ICD-10-CM | POA: Diagnosis not present

## 2014-07-21 DIAGNOSIS — O98419 Viral hepatitis complicating pregnancy, unspecified trimester: Secondary | ICD-10-CM | POA: Diagnosis not present

## 2014-07-21 DIAGNOSIS — Z3A36 36 weeks gestation of pregnancy: Secondary | ICD-10-CM | POA: Diagnosis not present

## 2014-07-21 DIAGNOSIS — O99323 Drug use complicating pregnancy, third trimester: Secondary | ICD-10-CM | POA: Diagnosis not present

## 2014-07-21 DIAGNOSIS — O0993 Supervision of high risk pregnancy, unspecified, third trimester: Secondary | ICD-10-CM | POA: Diagnosis not present

## 2014-07-31 DIAGNOSIS — O0993 Supervision of high risk pregnancy, unspecified, third trimester: Secondary | ICD-10-CM | POA: Diagnosis not present

## 2014-07-31 DIAGNOSIS — O98419 Viral hepatitis complicating pregnancy, unspecified trimester: Secondary | ICD-10-CM | POA: Diagnosis not present

## 2014-07-31 DIAGNOSIS — B182 Chronic viral hepatitis C: Secondary | ICD-10-CM | POA: Diagnosis not present

## 2014-07-31 DIAGNOSIS — O99320 Drug use complicating pregnancy, unspecified trimester: Secondary | ICD-10-CM | POA: Diagnosis not present

## 2014-08-08 DIAGNOSIS — B182 Chronic viral hepatitis C: Secondary | ICD-10-CM | POA: Diagnosis not present

## 2014-08-08 DIAGNOSIS — O99323 Drug use complicating pregnancy, third trimester: Secondary | ICD-10-CM | POA: Diagnosis not present

## 2014-08-08 DIAGNOSIS — O98413 Viral hepatitis complicating pregnancy, third trimester: Secondary | ICD-10-CM | POA: Diagnosis not present

## 2014-08-08 DIAGNOSIS — O09523 Supervision of elderly multigravida, third trimester: Secondary | ICD-10-CM | POA: Diagnosis not present

## 2014-08-11 DIAGNOSIS — O99324 Drug use complicating childbirth: Secondary | ICD-10-CM | POA: Diagnosis not present

## 2014-08-12 DIAGNOSIS — O99324 Drug use complicating childbirth: Secondary | ICD-10-CM | POA: Diagnosis not present

## 2014-08-12 DIAGNOSIS — O0993 Supervision of high risk pregnancy, unspecified, third trimester: Secondary | ICD-10-CM | POA: Diagnosis not present

## 2014-08-12 DIAGNOSIS — Z302 Encounter for sterilization: Secondary | ICD-10-CM | POA: Diagnosis not present

## 2014-08-12 DIAGNOSIS — O43193 Other malformation of placenta, third trimester: Secondary | ICD-10-CM | POA: Diagnosis not present

## 2014-09-28 DIAGNOSIS — L03115 Cellulitis of right lower limb: Secondary | ICD-10-CM | POA: Diagnosis not present

## 2014-09-28 DIAGNOSIS — T63301A Toxic effect of unspecified spider venom, accidental (unintentional), initial encounter: Secondary | ICD-10-CM | POA: Diagnosis not present

## 2014-09-28 DIAGNOSIS — M654 Radial styloid tenosynovitis [de Quervain]: Secondary | ICD-10-CM | POA: Diagnosis not present

## 2014-10-24 DIAGNOSIS — F319 Bipolar disorder, unspecified: Secondary | ICD-10-CM | POA: Diagnosis not present

## 2014-10-24 DIAGNOSIS — O98419 Viral hepatitis complicating pregnancy, unspecified trimester: Secondary | ICD-10-CM | POA: Diagnosis not present

## 2014-10-24 DIAGNOSIS — M791 Myalgia: Secondary | ICD-10-CM | POA: Diagnosis not present

## 2014-10-24 DIAGNOSIS — O99320 Drug use complicating pregnancy, unspecified trimester: Secondary | ICD-10-CM | POA: Diagnosis not present

## 2014-10-30 DIAGNOSIS — F315 Bipolar disorder, current episode depressed, severe, with psychotic features: Secondary | ICD-10-CM | POA: Diagnosis not present

## 2014-12-03 DIAGNOSIS — G8929 Other chronic pain: Secondary | ICD-10-CM | POA: Diagnosis not present

## 2014-12-03 DIAGNOSIS — M791 Myalgia: Secondary | ICD-10-CM | POA: Diagnosis not present

## 2015-05-31 IMAGING — US US OB TRANSVAGINAL
1 series · 14 of 28 positions shown · non-contrast
Comparison: None applicable

CLINICAL DATA: Cramping. Rule out ectopic. Quantitative beta HCG is
pending. By LMP the patient is 5 weeks 0 days.

EXAM:
OBSTETRIC <14 WK US AND TRANSVAGINAL OB US
TECHNIQUE: Both transabdominal and transvaginal ultrasound examinations were
performed for complete evaluation of the gestation as well as the
maternal uterus, adnexal regions, and pelvic cul-de-sac.
Transvaginal technique was performed to assess early pregnancy.

[Series 1: us ob transvaginal · 0.22mm/px · 14 of 82 slices shown]
[im 4/82]
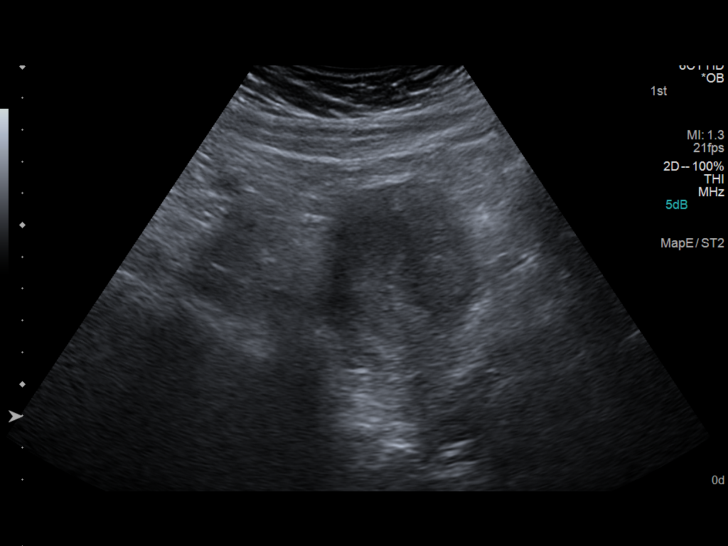
[im 10/82]
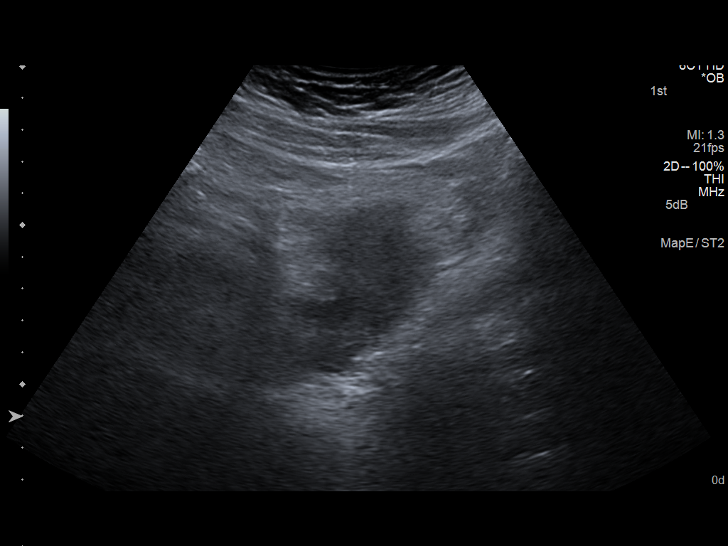
[im 16/82]
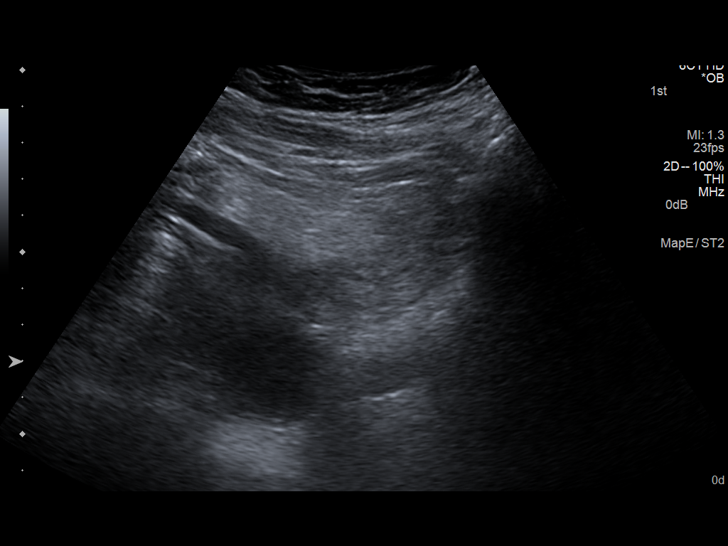
[im 22/82]
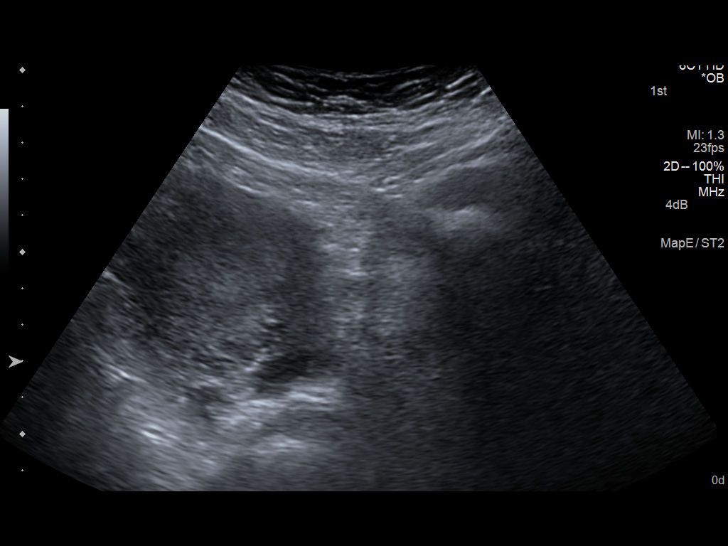
[im 28/82]
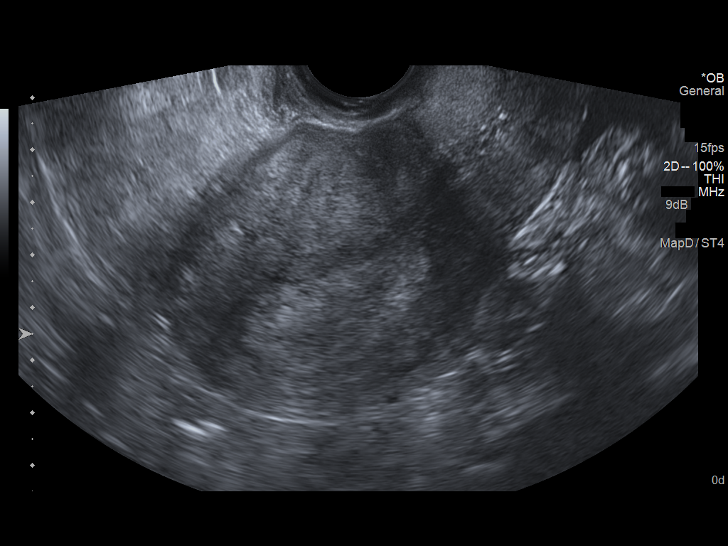
[im 34/82]
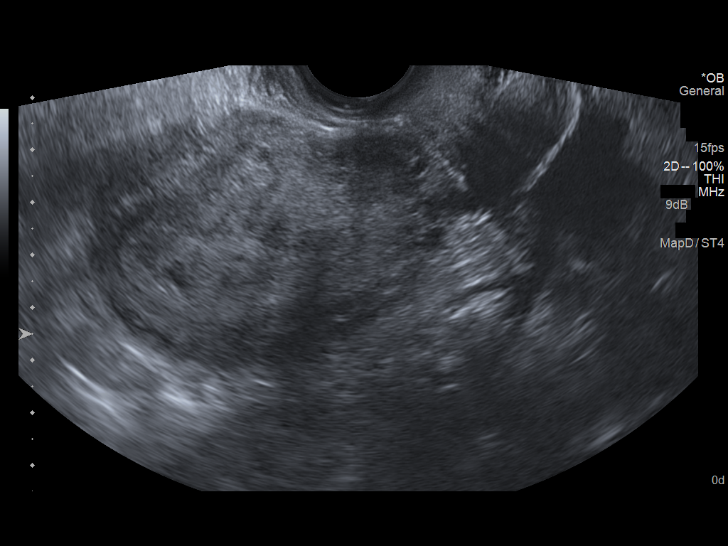
[im 40/82]
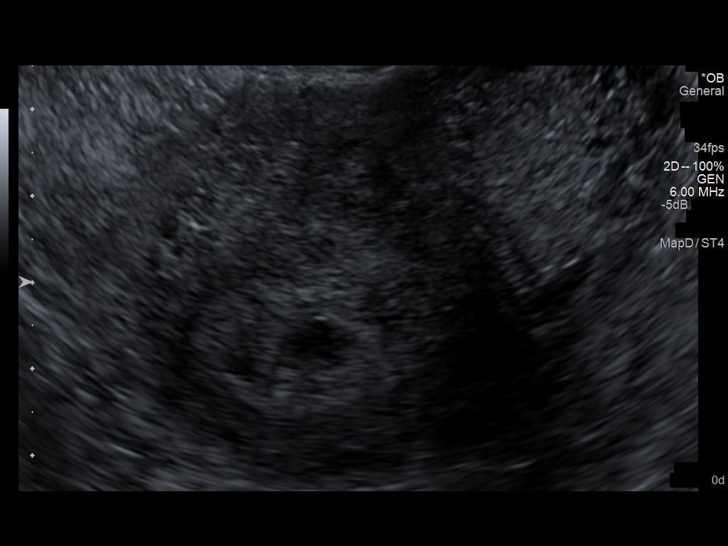
[im 46/82]
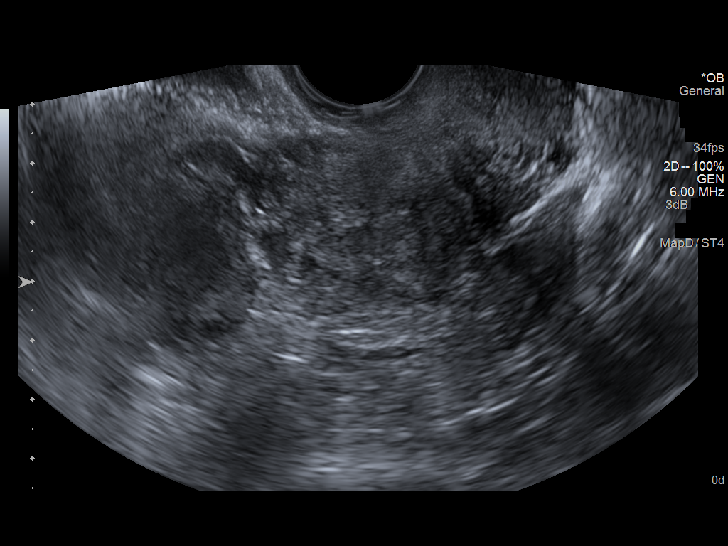
[im 52/82]
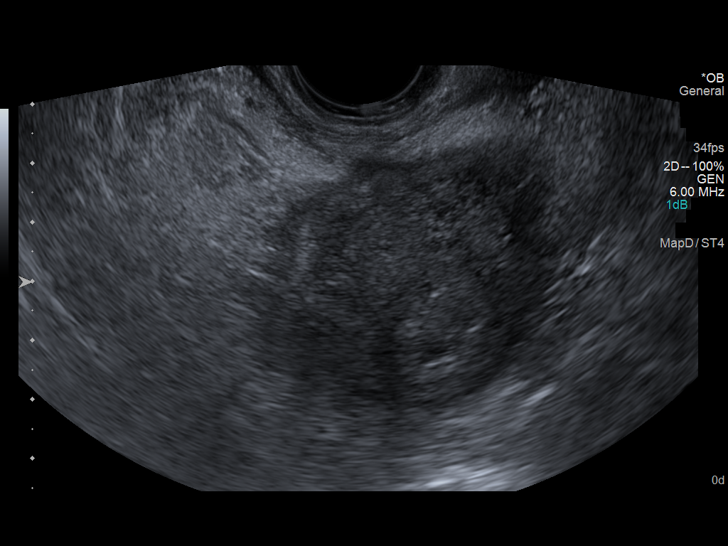
[im 58/82]
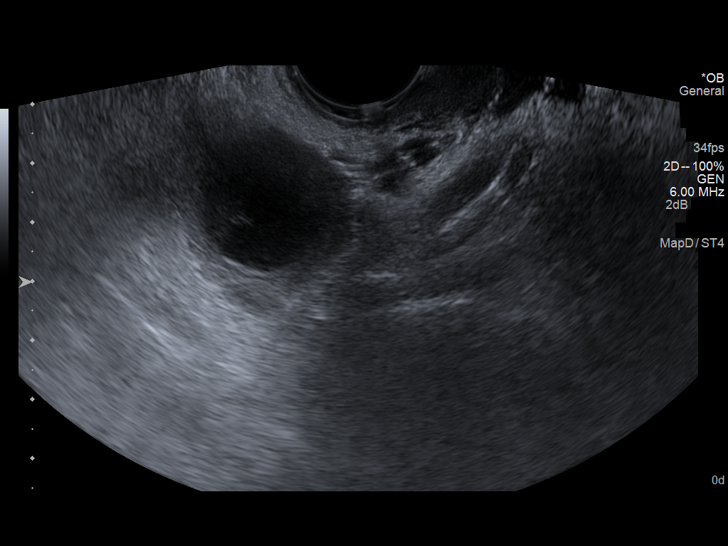
[im 64/82]
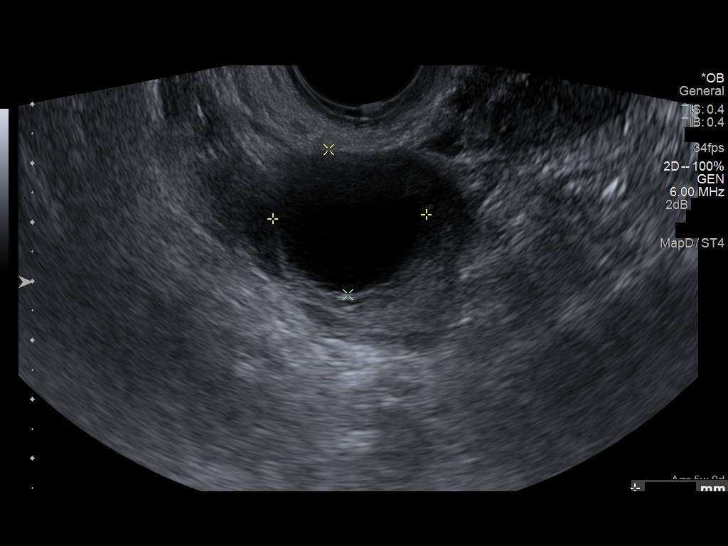
[im 70/82]
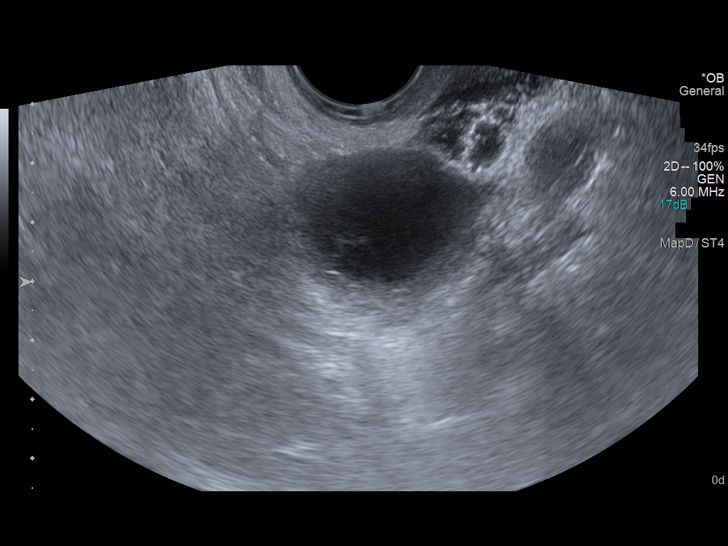
[im 76/82]
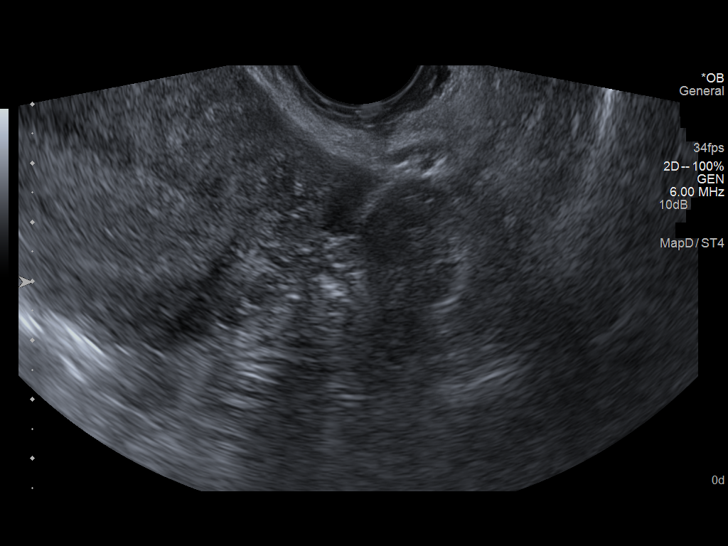
[im 82/82]
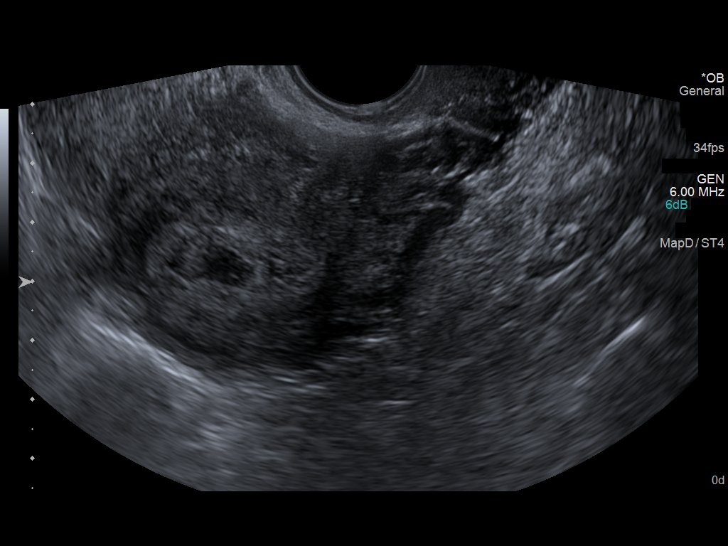

[14 of 28 positions shown; findings below may reference images not displayed]

FINDINGS: Intrauterine gestational sac: Present

Yolk sac:  Present

Embryo:  Not seen

Cardiac Activity: Not seen

MSD:  8.3  mm   5 w   4  d

Maternal uterus/adnexae: No subchorionic hemorrhage identified.
Right ovarian cyst measures 2.4 x 2.6 x 2.5 cm and contains internal
echoes. The left ovary has a normal appearance and measures 2.2 x
1.6 x 2.3 cm. Small amount of free pelvic fluid is noted.
IMPRESSION: 1. Intrauterine gestational sac with yolk sac.
2. Embryo is not yet visible.
3. Followup ultrasound is suggested in 2 weeks to establish presence
of embryo and for dating purposes.
4. Probable minimally complex right corpus luteum cyst. Followup is
recommended.

## 2015-11-13 IMAGING — US US OB LIMITED
1 series · 4 of 4 positions shown · non-contrast
Comparison: none

CLINICAL DATA: Left lower quadrant pain and back pain. Estimated
gestational age by LMP is 27 weeks 0 days. No prenatal care.

EXAM:
LIMITED OBSTETRIC ULTRASOUND

[Series 1: us ob limited · 4 acquisitions, 4 frames shown]
[im 1/4]
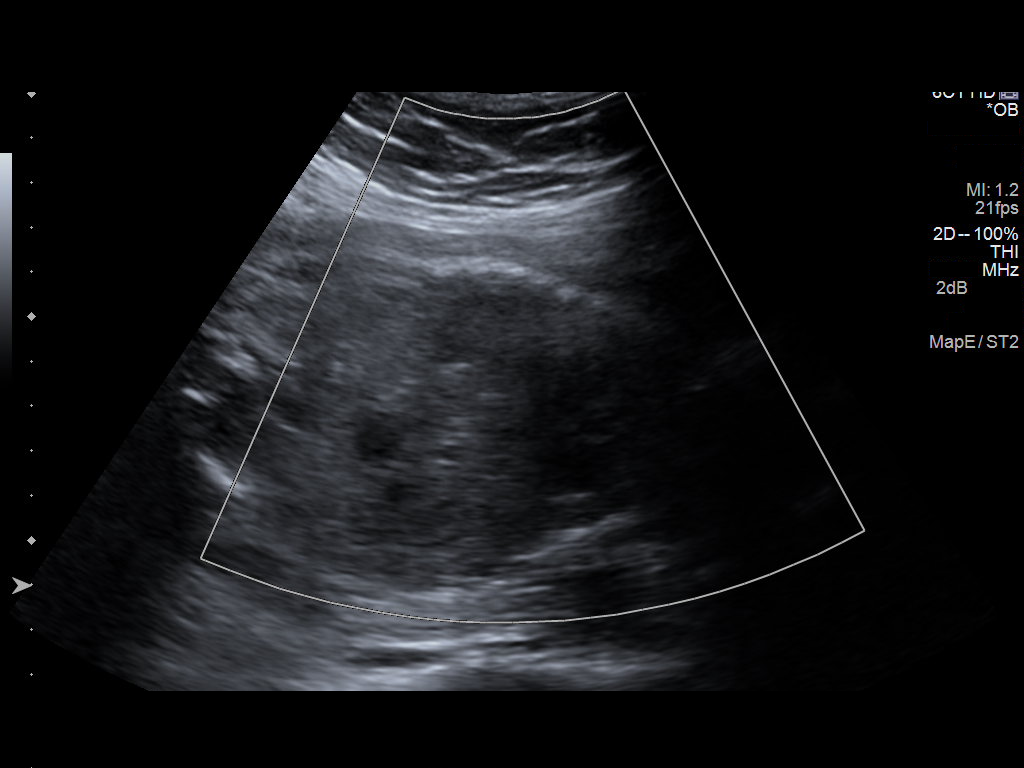
[im 2/4]
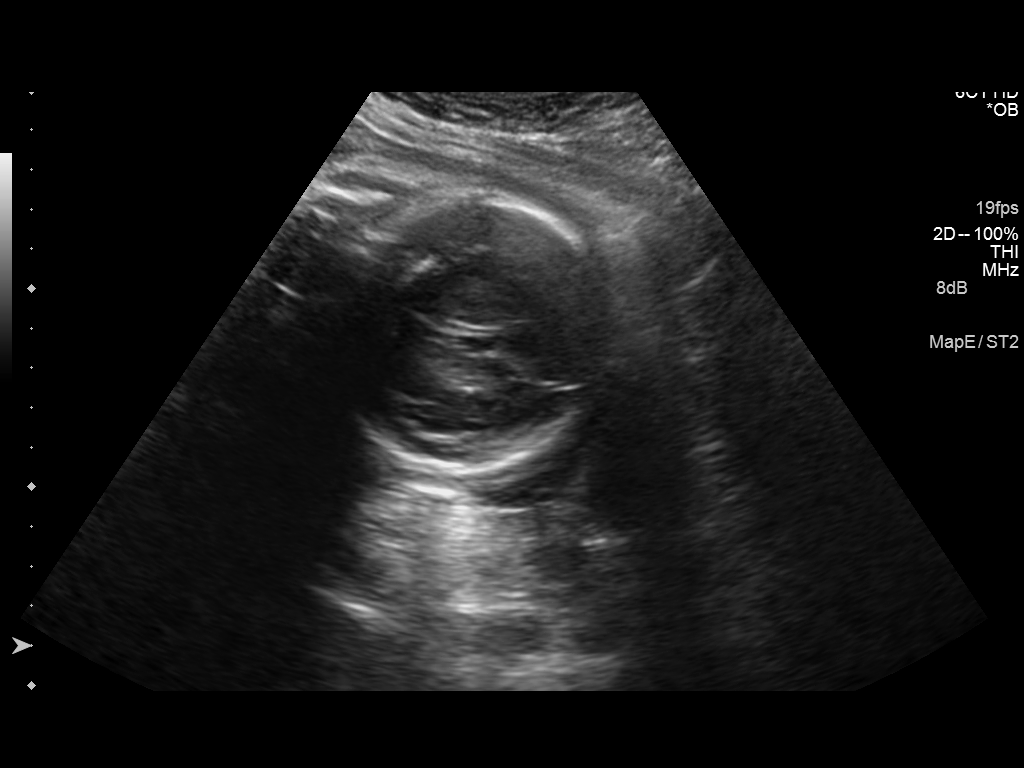
[im 3/4]
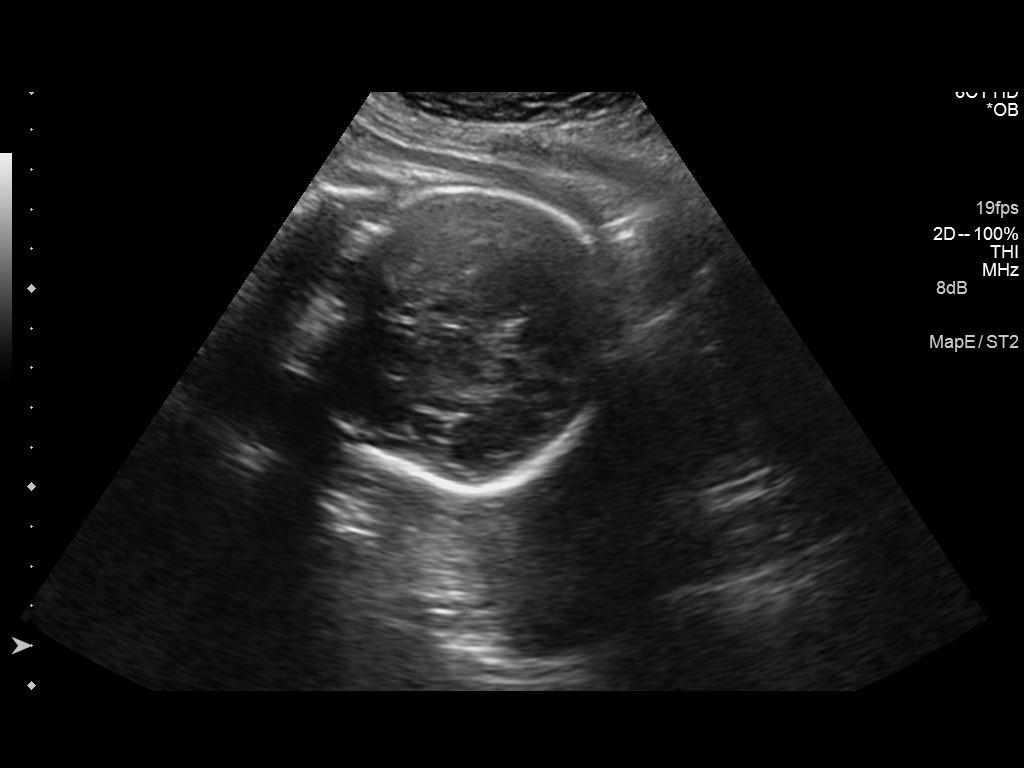
[im 4/4]
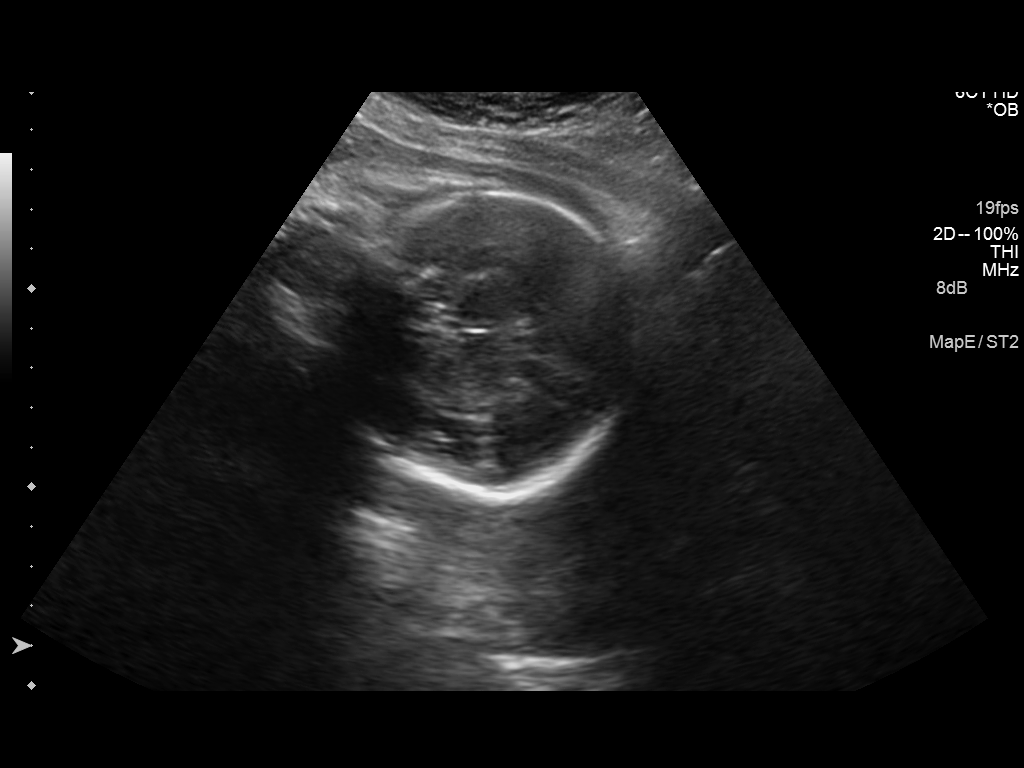

[4 of 4 positions shown; findings below may reference images not displayed]

FINDINGS: Number of Fetuses: 1

Heart Rate:  144 bpm

Movement: Yes

Presentation: Cephalic

Placental Location: Anterior

Previa: No

Amniotic Fluid (Subjective):  Within normal limits.

BPD:  7.4cm 29w  4d

MATERNAL FINDINGS:

Cervix:  Cervix is not visualized.

Uterus/Adnexae: Limited visualization. No specific abnormality
identified. Ovaries are not identified.
IMPRESSION: Single intrauterine pregnancy. Fetus is in cephalic presentation.
Anterior placenta without previa. No acute complication is suggested
on limited examination.

This exam is performed on an emergent basis and does not
comprehensively evaluate fetal size, dating, or anatomy; follow-up
complete OB US should be considered if further fetal assessment is
warranted.

## 2016-04-28 DIAGNOSIS — L0100 Impetigo, unspecified: Secondary | ICD-10-CM | POA: Diagnosis not present

## 2016-06-21 DIAGNOSIS — R0781 Pleurodynia: Secondary | ICD-10-CM | POA: Diagnosis not present

## 2016-06-21 DIAGNOSIS — R079 Chest pain, unspecified: Secondary | ICD-10-CM | POA: Diagnosis not present

## 2016-07-13 DIAGNOSIS — J0100 Acute maxillary sinusitis, unspecified: Secondary | ICD-10-CM | POA: Diagnosis not present

## 2016-09-09 DIAGNOSIS — R45851 Suicidal ideations: Secondary | ICD-10-CM | POA: Diagnosis not present

## 2016-09-09 DIAGNOSIS — F29 Unspecified psychosis not due to a substance or known physiological condition: Secondary | ICD-10-CM | POA: Diagnosis not present

## 2016-09-30 DIAGNOSIS — F419 Anxiety disorder, unspecified: Secondary | ICD-10-CM | POA: Diagnosis not present

## 2016-09-30 DIAGNOSIS — G40901 Epilepsy, unspecified, not intractable, with status epilepticus: Secondary | ICD-10-CM | POA: Diagnosis not present

## 2016-10-06 DIAGNOSIS — T1491XA Suicide attempt, initial encounter: Secondary | ICD-10-CM | POA: Diagnosis not present

## 2016-10-06 DIAGNOSIS — F29 Unspecified psychosis not due to a substance or known physiological condition: Secondary | ICD-10-CM | POA: Diagnosis not present

## 2016-10-08 ENCOUNTER — Emergency Department (HOSPITAL_COMMUNITY)
Admission: EM | Admit: 2016-10-08 | Discharge: 2016-10-10 | Disposition: A | Discharge status: Home / Self Care | Payer: Medicare Other | Attending: Emergency Medicine | Admitting: Emergency Medicine

## 2016-10-08 ENCOUNTER — Encounter (HOSPITAL_COMMUNITY): Payer: Self-pay | Admitting: Nurse Practitioner

## 2016-10-08 DIAGNOSIS — R45851 Suicidal ideations: Secondary | ICD-10-CM | POA: Diagnosis not present

## 2016-10-08 DIAGNOSIS — F1490 Cocaine use, unspecified, uncomplicated: Secondary | ICD-10-CM | POA: Diagnosis not present

## 2016-10-08 DIAGNOSIS — F1994 Other psychoactive substance use, unspecified with psychoactive substance-induced mood disorder: Secondary | ICD-10-CM | POA: Diagnosis present

## 2016-10-08 DIAGNOSIS — F17210 Nicotine dependence, cigarettes, uncomplicated: Secondary | ICD-10-CM | POA: Diagnosis not present

## 2016-10-08 DIAGNOSIS — F29 Unspecified psychosis not due to a substance or known physiological condition: Secondary | ICD-10-CM | POA: Diagnosis not present

## 2016-10-08 DIAGNOSIS — F1910 Other psychoactive substance abuse, uncomplicated: Secondary | ICD-10-CM | POA: Diagnosis not present

## 2016-10-08 DIAGNOSIS — F332 Major depressive disorder, recurrent severe without psychotic features: Secondary | ICD-10-CM | POA: Diagnosis present

## 2016-10-08 DIAGNOSIS — F1123 Opioid dependence with withdrawal: Secondary | ICD-10-CM | POA: Insufficient documentation

## 2016-10-08 DIAGNOSIS — F1190 Opioid use, unspecified, uncomplicated: Secondary | ICD-10-CM | POA: Diagnosis not present

## 2016-10-08 DIAGNOSIS — F1120 Opioid dependence, uncomplicated: Secondary | ICD-10-CM | POA: Diagnosis present

## 2016-10-08 DIAGNOSIS — F1920 Other psychoactive substance dependence, uncomplicated: Secondary | ICD-10-CM

## 2016-10-08 LAB — CBC
HCT: 38.2 % (ref 36.0–46.0)
HEMOGLOBIN: 12.9 g/dL (ref 12.0–15.0)
MCH: 28 pg (ref 26.0–34.0)
MCHC: 33.8 g/dL (ref 30.0–36.0)
MCV: 82.9 fL (ref 78.0–100.0)
PLATELETS: 228 10*3/uL (ref 150–400)
RBC: 4.61 MIL/uL (ref 3.87–5.11)
RDW: 14 % (ref 11.5–15.5)
WBC: 9.4 10*3/uL (ref 4.0–10.5)

## 2016-10-08 LAB — COMPREHENSIVE METABOLIC PANEL
ALT: 56 U/L — ABNORMAL HIGH (ref 14–54)
ANION GAP: 7 (ref 5–15)
AST: 19 U/L (ref 15–41)
Albumin: 3.9 g/dL (ref 3.5–5.0)
Alkaline Phosphatase: 75 U/L (ref 38–126)
BUN: 15 mg/dL (ref 6–20)
CHLORIDE: 104 mmol/L (ref 101–111)
CO2: 27 mmol/L (ref 22–32)
Calcium: 8.9 mg/dL (ref 8.9–10.3)
Creatinine, Ser: 0.72 mg/dL (ref 0.44–1.00)
Glucose, Bld: 111 mg/dL — ABNORMAL HIGH (ref 65–99)
POTASSIUM: 3 mmol/L — AB (ref 3.5–5.1)
SODIUM: 138 mmol/L (ref 135–145)
Total Bilirubin: 0.4 mg/dL (ref 0.3–1.2)
Total Protein: 7.1 g/dL (ref 6.5–8.1)

## 2016-10-08 LAB — RAPID URINE DRUG SCREEN, HOSP PERFORMED
AMPHETAMINES: POSITIVE — AB
BENZODIAZEPINES: NOT DETECTED
Barbiturates: NOT DETECTED
COCAINE: NOT DETECTED
OPIATES: NOT DETECTED
Tetrahydrocannabinol: NOT DETECTED

## 2016-10-08 LAB — SALICYLATE LEVEL: Salicylate Lvl: <7 mg/dL (ref 2.8–30.0)

## 2016-10-08 LAB — ETHANOL

## 2016-10-08 LAB — ACETAMINOPHEN LEVEL

## 2016-10-08 MED ORDER — POTASSIUM CHLORIDE CRYS ER 20 MEQ PO TBCR
60.0000 meq | EXTENDED_RELEASE_TABLET | Freq: Once | ORAL | Status: AC
  Administered 2016-10-08: 60 meq via ORAL
  Filled 2016-10-08: qty 3

## 2016-10-08 MED ORDER — ARIPIPRAZOLE 5 MG PO TABS
15.0000 mg | ORAL_TABLET | Freq: Every day | ORAL | Status: DC
  Administered 2016-10-08 – 2016-10-10 (×3): 15 mg via ORAL
  Filled 2016-10-08 (×3): qty 1

## 2016-10-08 MED ORDER — IBUPROFEN 200 MG PO TABS: 600.0000 mg | ORAL_TABLET | Freq: Three times a day (TID) | ORAL | Status: DC | PRN

## 2016-10-08 MED ORDER — ONDANSETRON HCL 4 MG PO TABS
4.0000 mg | ORAL_TABLET | Freq: Three times a day (TID) | ORAL | Status: DC | PRN
  Administered 2016-10-09: 4 mg via ORAL
  Filled 2016-10-08: qty 1

## 2016-10-08 MED ORDER — ACETAMINOPHEN 325 MG PO TABS: 650.0000 mg | ORAL_TABLET | ORAL | Status: DC | PRN

## 2016-10-08 MED ORDER — LOPERAMIDE HCL 2 MG PO CAPS: 4.0000 mg | ORAL_CAPSULE | ORAL | Status: DC | PRN

## 2016-10-08 MED ORDER — OXCARBAZEPINE 300 MG PO TABS
300.0000 mg | ORAL_TABLET | Freq: Two times a day (BID) | ORAL | Status: DC
  Administered 2016-10-08 – 2016-10-10 (×5): 300 mg via ORAL
  Filled 2016-10-08 (×5): qty 1

## 2016-10-08 MED ORDER — GABAPENTIN 300 MG PO CAPS
300.0000 mg | ORAL_CAPSULE | Freq: Three times a day (TID) | ORAL | Status: DC
  Administered 2016-10-08 – 2016-10-09 (×6): 300 mg via ORAL
  Filled 2016-10-08 (×6): qty 1

## 2016-10-08 NOTE — BH Assessment (Signed)
[facility] Assessment Progress Note Case was staffed with Eksir MD who recommended patient be re-evaluated in the a.m.      
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
## 2016-10-08 NOTE — ED Triage Notes (Signed)
Pt states she is feeling psychotic and having thoughts of killing herself. Adds that she has been abusing meth and heroine and "almost overdosed a couple of days ago." Denies any particular plan but states she has been self cutting on her wrists, superficial bilateral wrist cuts visible.

## 2016-10-08 NOTE — ED Notes (Signed)
SBAR Report received from previous nurse. Pt received calm and visible on unit. Pt gave no answers to assessment questions related to current SI/ HI, A/V H, depression, anxiety, or pain at this time, and appears otherwise stable and free of distress. Pt reminded of camera surveillance, q 15 min rounds, and rules of the milieu. Will continue to assess.

## 2016-10-08 NOTE — ED Notes (Signed)
Introduced self to patient. Pt oriented to unit expectations.  Assessed pt for:  A) Anxiety &/or agitation: On admission to the [facility] pt would not respond to introductions or questions about her situation or state of mind. She took medication offered, but rolled over, without saying a word, and appeared to go back to sleep.   S) Safety: Safety maintained with q-15-minute checks and hourly rounds by staff.  A) ADLs: Pt able to perform ADLs independently.  P) Pick-Up (room cleanliness): Pt's room clean and free of clutter.

## 2016-10-08 NOTE — ED Provider Notes (Signed)
[facility]Y DEPT Provider Note   CSN: [id number] Arrival date & time: 10/08/16  0149     History   Chief Complaint Chief Complaint  Patient presents with  . Feeling Psychotic  . Suicidal    HPI Christina Burch is a 36 y.o. female.  HPI Pt comes in with cc of suicidal thoughts an depression. Pt has not been taking her meds for several months and gradually declining. She has been abusing meth and heroine. Pt denies nausea, emesis, fevers, chills, chest pains, shortness of breath, headaches, abdominal pain, uti like symptoms. She is having some symptoms of heroine withdrawals - sweats, diarrhea.    Past Medical History:  Diagnosis Date  . Addiction to drug (HCC)   . Borderline personality disorder   . Pregnant   . Suicidal thoughts     Patient Active Problem List   Diagnosis Date Noted  . Polysubstance dependence (HCC) 06/04/2014  . Suicidal ideations 06/04/2014  . Opioid dependence (HCC) 12/18/2013  . 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 CHOLECYSTECTOMY      OB History    Gravida Para Term Preterm AB Living   1             SAB TAB Ectopic Multiple Live Births                   Home Medications    Prior to Admission medications   Medication Sig Start Date End Date Taking? Authorizing Provider  albuterol (PROVENTIL HFA;VENTOLIN HFA) 108 (90 BASE) MCG/ACT inhaler Inhale 2 puffs into the lungs every 6 (six) hours as needed for wheezing or shortness of breath (chest tightness). 12/24/13  Yes Armandina Stammer I, NP  benzocaine (ORAJEL) 10 % mucosal gel Use as directed in the mouth or throat 4 (four) times daily as needed for mouth pain. Patient taking differently: Use as directed 1 application in the mouth or throat 4 (four) times daily as needed for mouth pain.  12/24/13  Yes Armandina Stammer I, NP  ibuprofen (ADVIL,MOTRIN) 200 MG tablet Take 600-800 mg by mouth every 6  (six) hours as needed for moderate pain (pain).    Yes [provider]  amoxicillin-clavulanate (AUGMENTIN) 875-125 MG per tablet Take 1 tablet by mouth every 12 (twelve) hours. For upper respiratory infection Patient not taking: Reported on 05/31/2014 12/24/13   Armandina Stammer I, NP  ARIPiprazole (ABILIFY) 15 MG tablet Take 1 tablet (15 mg total) by mouth daily. For mood control Patient not taking: Reported on 05/31/2014 12/24/13   Armandina Stammer I, NP  dextromethorphan-guaiFENesin (MUCINEX DM) 30-600 MG per 12 hr tablet Take 1 tablet by mouth 2 (two) times daily. For cough Patient not taking: Reported on 05/31/2014 12/24/13   Armandina Stammer I, NP  gabapentin (NEURONTIN) 300 MG capsule Take 1 capsule (300 mg total) by mouth 3 (three) times daily. For agitation/substance withdrawal syndrome Patient not taking: Reported on 10/08/2016 12/24/13   Armandina Stammer I, NP  Oxcarbazepine (TRILEPTAL) 300 MG tablet Take 1 tablet (300 mg total) by mouth 2 (two) times daily. For mood stabilization Patient not taking: Reported on 05/31/2014 12/24/13   Armandina Stammer I, NP  oxybutynin (DITROPAN) 5 MG tablet Take 1 tablet (5 mg total) by mouth daily. For bladder spasm Patient not taking: Reported on 05/31/2014 12/24/13   Sanjuana Kava, NP    Family History History reviewed. No pertinent family history.  Social History  Social History  Substance Use Topics  . Smoking status: Current Every Day Smoker    Packs/day: 0.50    Types: Cigarettes  . Smokeless tobacco: Former NeurosurgeonUser  . Alcohol use No     Allergies   Abilify [aripiprazole] and Clonidine derivatives   Review of Systems Review of Systems  All other systems reviewed and are negative.    Physical Exam Updated Vital Signs BP (!) 136/105 (BP Location: Left Arm)   Pulse 95   Temp 98.3 F (36.8 C) (Oral)   Resp 18   SpO2 99%   Physical Exam  Constitutional: She is oriented to person, place, and time. She appears well-developed and well-nourished.    HENT:  Head: Normocephalic and atraumatic.  Eyes: EOM are normal. Pupils are equal, round, and reactive to light.  Neck: Neck supple.  Cardiovascular: Normal rate, regular rhythm and normal heart sounds.   No murmur heard. Pulmonary/Chest: Effort normal. No respiratory distress.  Abdominal: Soft. She exhibits no distension. There is no tenderness. There is no rebound and no guarding.  Neurological: She is alert and oriented to person, place, and time.  Skin: Skin is warm. She is not diaphoretic.  Psychiatric: She has a normal mood and affect.  Nursing note and vitals reviewed.    ED Treatments / Results  Labs (all labs ordered are listed, but only abnormal results are displayed) Labs Reviewed  COMPREHENSIVE METABOLIC PANEL - Abnormal; Notable for the following:       Result Value   Potassium 3.0 (*)    Glucose, Bld 111 (*)    ALT 56 (*)    All other components within normal limits  ACETAMINOPHEN LEVEL - Abnormal; Notable for the following:    Acetaminophen (Tylenol), Serum <10 (*)    All other components within normal limits  RAPID URINE DRUG SCREEN, HOSP PERFORMED - Abnormal; Notable for the following:    Amphetamines POSITIVE (*)    All other components within normal limits  ETHANOL  SALICYLATE LEVEL  CBC  I-STAT BETA HCG BLOOD, ED ([facility], [facility], [facility] ONLY)    EKG  EKG Interpretation None       Radiology No results found.  Procedures Procedures (including critical care time)  Medications Ordered in ED Medications  ondansetron (ZOFRAN) tablet 4 mg (not administered)  ibuprofen (ADVIL,MOTRIN) tablet 600 mg (not administered)  acetaminophen (TYLENOL) tablet 650 mg (not administered)  ARIPiprazole (ABILIFY) tablet 15 mg (not administered)  Oxcarbazepine (TRILEPTAL) tablet 300 mg (not administered)  gabapentin (NEURONTIN) capsule 300 mg (not administered)  potassium chloride SA (K-DUR,KLOR-CON) CR tablet 60 mEq (not administered)     Initial Impression /  Assessment and Plan / ED Course  I have reviewed the triage vital signs and the nursing notes.  Pertinent labs & imaging results that were available during my care of the patient were reviewed by me and considered in my medical decision making (see chart for details).     Pt comes in with cc of SI. Pt is having heroine withdrawals, mild. Pt is medically cleared for psych eval.   Final Clinical Impressions(s) / ED Diagnoses   Final diagnoses:  Suicidal ideations  Polysubstance abuse    New Prescriptions New Prescriptions   No medications on file     Derwood KaplanNanavati, Joesiah Lonon, MD 10/08/16 [phone]

## 2016-10-08 NOTE — ED Notes (Signed)
Bed: [facility] Expected date:  Expected time:  Means of arrival:  Comments: 

## 2016-10-09 ENCOUNTER — Encounter (HOSPITAL_COMMUNITY): Payer: Self-pay | Admitting: Registered Nurse

## 2016-10-09 DIAGNOSIS — F1123 Opioid dependence with withdrawal: Secondary | ICD-10-CM | POA: Diagnosis not present
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
## 2016-10-09 NOTE — BH Assessment (Signed)
[facility] [id number], [place], [address] [place], [place], [place], [place] and [place]

## 2016-10-09 NOTE — ED Notes (Signed)
SBAR Report received from previous nurse. Pt received calm and visible on unit. Pt asleep and gave no meaningful asnwers to assessment questions related to current SI/ HI, A/V H, depression, anxiety, or pain at this time, and appears otherwise stable and free of distress. Pt reminded of camera surveillance, q 15 min rounds, and rules of the milieu. Will continue to assess.

## 2016-10-09 NOTE — Progress Notes (Signed)
Pt A & O X4. Spent majority to this shift in bed, OOB to bathroom X1, this AM. Denies HI, AVH and pain when assessed. Endorsed passive SI with plan to cut herself, verbally contracts for safety. Reports withdrawal symptoms of "nausea, hot and cold sweats". Declined to shower and change scrubs when prompted. Took her medications when offered. Denies adverse drug reactions at this time. Emotional support and encouragement provided to pt on as need basis. Routine safety checks maintained without self harm gestures to note thus far.

## 2016-10-10 DIAGNOSIS — F1190 Opioid use, unspecified, uncomplicated: Secondary | ICD-10-CM | POA: Diagnosis not present

## 2016-10-10 DIAGNOSIS — F1490 Cocaine use, unspecified, uncomplicated: Secondary | ICD-10-CM

## 2016-10-10 DIAGNOSIS — F1994 Other psychoactive substance use, unspecified with psychoactive substance-induced mood disorder: Secondary | ICD-10-CM

## 2016-10-10 DIAGNOSIS — F17210 Nicotine dependence, cigarettes, uncomplicated: Secondary | ICD-10-CM

## 2016-10-10 DIAGNOSIS — F1123 Opioid dependence with withdrawal: Secondary | ICD-10-CM | POA: Diagnosis not present

## 2016-10-10 MED ORDER — GABAPENTIN 300 MG PO CAPS: 300.0000 mg | ORAL_CAPSULE | Freq: Three times a day (TID) | ORAL | 0 refills | Status: AC

## 2016-10-10 MED ORDER — ARIPIPRAZOLE 15 MG PO TABS: 15.0000 mg | ORAL_TABLET | Freq: Every day | ORAL | 0 refills | Status: AC

## 2016-10-10 MED ORDER — OXCARBAZEPINE 300 MG PO TABS: 300.0000 mg | ORAL_TABLET | Freq: Two times a day (BID) | ORAL | 0 refills | Status: AC

## 2016-10-10 MED ORDER — IBUPROFEN 200 MG PO TABS: 600.0000 mg | ORAL_TABLET | Freq: Three times a day (TID) | ORAL | Status: DC | PRN

## 2016-10-10 NOTE — BH Assessment (Signed)
[facility] Assessment Progress Note  Per Thedore MinsMojeed Akintayo, MD, this pt does not require psychiatric hospitalization at this time.  [facility], [place].  This has been included in pt's discharge instructions.  Pt's nurse, Carlisle BeersLuann, has been notified.  Doylene Canninghomas Vashon Riordan, MA Triage Specialist [phone]

## 2016-10-10 NOTE — Progress Notes (Signed)
RN reports patient called several people to get ride home, at this time she did not have transportation home CSW supervisor approved taxi voucher home. CSW spoke with patient, confirmed address and  gave voucher to RN. No other needs identified.  .Marland Kitchen
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
## 2016-10-10 NOTE — Discharge Instructions (Signed)
For your ongoing behavioral health needs, you are advised to follow up with one of the following providers.  Contact them at your earliest opportunity to ask about enrolling in their program:   [facility]  [address]  [place], [place] [address]  [phone]       [facility]      [address]      , [place] [address]      [phone]

## 2016-10-10 NOTE — [facility] Suicide Risk Assessment (Signed)
Suicide Risk Assessment  Discharge Assessment   [facility]   Principal Problem: Substance induced mood disorder [facility](HCC) Discharge Diagnoses:  Patient Active Problem List   Diagnosis Date Noted  . Substance induced mood disorder (HCC) [F19.94] 10/10/2016    Priority: High  . Polysubstance dependence (HCC) [F19.20] 06/04/2014    Priority: High  . Opioid dependence (HCC) [F11.20] 12/18/2013    Priority: High    Total Time spent with patient: 45 minutes   Musculoskeletal: Strength & Muscle Tone: within normal limits Gait & Station: normal Patient leans: N/A  Psychiatric Specialty Exam: Physical Exam  Constitutional: She is oriented to person, place, and time. She appears well-developed and well-nourished.  HENT:  Head: Normocephalic.  Neck: Normal range of motion.  Respiratory: Effort normal.  Musculoskeletal: Normal range of motion.  Neurological: She is alert and oriented to person, place, and time.  Psychiatric: Her speech is normal and behavior is normal. Judgment and thought content normal. Her mood appears anxious. Cognition and memory are normal.    Review of Systems  Psychiatric/Behavioral: Positive for substance abuse. The patient is nervous/anxious.   All other systems reviewed and are negative.   Blood pressure 111/67, pulse 82, temperature 99.1 F (37.3 C), temperature source Oral, resp. rate 17, SpO2 98 %.There is no height or weight on file to calculate BMI.  General Appearance: Casual  Eye Contact:  Good  Speech:  Normal Rate  Volume:  Normal  Mood:  Anxious, mild  Affect:  Congruent  Thought Process:  Coherent and Descriptions of Associations: Intact  Orientation:  Full (Time, Place, and Person)  Thought Content:  WDL and Logical  Suicidal Thoughts:  No  Homicidal Thoughts:  No  Memory:  Immediate;   Good Recent;   Good Remote;   Good  Judgement:  Fair  Insight:  Fair  Psychomotor Activity:  Normal  Concentration:   Concentration: Good and Attention Span: Good  Recall:  Good  Fund of Knowledge:  Fair  Language:  Good  Akathisia:  No  Handed:  Right  AIMS (if indicated):     Assets:  Leisure Time Physical Health Resilience  ADL's:  Intact  Cognition:  WNL  Sleep:       Mental Status Per Nursing Assessment::   On Admission:   substance abuse with suicidal ideations  Demographic Factors:  Caucasian  Loss Factors: Legal issues  Historical Factors: NA  Risk Reduction Factors:   Sense of responsibility to family  Continued Clinical Symptoms:  Anxiety, mild  Cognitive Features That Contribute To Risk:  None    Suicide Risk:  Minimal: No identifiable suicidal ideation.  Patients presenting with no risk factors but with morbid ruminations; may be classified as minimal risk based on the severity of the depressive symptoms    Plan Of Care/Follow-up recommendations:  Activity:  as tolerated Diet:  heart healthy diet  Alyjah Lovingood, NP 10/10/2016, 2:37 PM

## 2016-11-03 DIAGNOSIS — F319 Bipolar disorder, unspecified: Secondary | ICD-10-CM | POA: Diagnosis not present

## 2016-11-03 DIAGNOSIS — G4489 Other headache syndrome: Secondary | ICD-10-CM | POA: Diagnosis not present

## 2016-11-03 DIAGNOSIS — S0990XA Unspecified injury of head, initial encounter: Secondary | ICD-10-CM | POA: Diagnosis not present

## 2016-11-03 DIAGNOSIS — S0083XA Contusion of other part of head, initial encounter: Secondary | ICD-10-CM | POA: Diagnosis not present

## 2016-11-03 DIAGNOSIS — Z9114 Patient's other noncompliance with medication regimen: Secondary | ICD-10-CM | POA: Diagnosis not present

## 2016-11-03 DIAGNOSIS — R569 Unspecified convulsions: Secondary | ICD-10-CM | POA: Diagnosis not present

## 2016-11-03 DIAGNOSIS — R55 Syncope and collapse: Secondary | ICD-10-CM | POA: Diagnosis not present

## 2016-11-03 DIAGNOSIS — S299XXA Unspecified injury of thorax, initial encounter: Secondary | ICD-10-CM | POA: Diagnosis not present

## 2016-12-28 DIAGNOSIS — M79652 Pain in left thigh: Secondary | ICD-10-CM | POA: Diagnosis not present

## 2016-12-28 DIAGNOSIS — L02421 Furuncle of right axilla: Secondary | ICD-10-CM | POA: Diagnosis not present

## 2017-12-09 DIAGNOSIS — K5909 Other constipation: Secondary | ICD-10-CM | POA: Diagnosis not present

## 2017-12-26 DIAGNOSIS — F319 Bipolar disorder, unspecified: Secondary | ICD-10-CM | POA: Diagnosis not present

## 2018-01-11 DIAGNOSIS — S50811A Abrasion of right forearm, initial encounter: Secondary | ICD-10-CM | POA: Diagnosis not present

## 2018-01-11 DIAGNOSIS — R609 Edema, unspecified: Secondary | ICD-10-CM | POA: Diagnosis not present

## 2018-01-11 DIAGNOSIS — A281 Cat-scratch disease: Secondary | ICD-10-CM | POA: Diagnosis not present

## 2018-01-11 DIAGNOSIS — Z79899 Other long term (current) drug therapy: Secondary | ICD-10-CM | POA: Diagnosis not present

## 2018-01-11 DIAGNOSIS — F17210 Nicotine dependence, cigarettes, uncomplicated: Secondary | ICD-10-CM | POA: Diagnosis not present

## 2018-01-11 DIAGNOSIS — S60512A Abrasion of left hand, initial encounter: Secondary | ICD-10-CM | POA: Diagnosis not present

## 2018-05-10 DIAGNOSIS — J22 Unspecified acute lower respiratory infection: Secondary | ICD-10-CM | POA: Diagnosis not present

## 2018-09-26 DIAGNOSIS — K0889 Other specified disorders of teeth and supporting structures: Secondary | ICD-10-CM | POA: Diagnosis not present

## 2018-09-26 DIAGNOSIS — Z5329 Procedure and treatment not carried out because of patient's decision for other reasons: Secondary | ICD-10-CM | POA: Diagnosis not present

## 2019-01-28 ENCOUNTER — Other Ambulatory Visit: Payer: Self-pay

## 2019-01-28 ENCOUNTER — Ambulatory Visit
Admission: EM | Admit: 2019-01-28 | Discharge: 2019-01-28 | Discharge status: Absent without leave | Payer: Medicare Other
